# Patient Record
Sex: Female | Born: 1990 | Race: Black or African American | Hispanic: No | Marital: Married | Smoking: Never smoker
Health system: Southern US, Community
[De-identification: ages and names within clinical notes are randomized; demographics above are authoritative.]

## PROBLEM LIST (undated history)

## (undated) DIAGNOSIS — IMO0002 Reserved for concepts with insufficient information to code with codable children: Secondary | ICD-10-CM

## (undated) DIAGNOSIS — F329 Major depressive disorder, single episode, unspecified: Secondary | ICD-10-CM

## (undated) HISTORY — DX: Reserved for concepts with insufficient information to code with codable children: IMO0002

## (undated) HISTORY — DX: Major depressive disorder, single episode, unspecified: F32.9

---

## 2013-02-02 HISTORY — PX: BREAST BIOPSY: SHX20

## 2014-09-24 ENCOUNTER — Other Ambulatory Visit (HOSPITAL_COMMUNITY): Payer: Self-pay | Admitting: Maternal and Fetal Medicine

## 2014-09-24 DIAGNOSIS — Z3689 Encounter for other specified antenatal screening: Secondary | ICD-10-CM

## 2014-09-24 DIAGNOSIS — Z3A33 33 weeks gestation of pregnancy: Secondary | ICD-10-CM

## 2014-09-24 LAB — OB RESULTS CONSOLE ABO/RH: RH TYPE: POSITIVE

## 2014-09-24 LAB — OB RESULTS CONSOLE ANTIBODY SCREEN: ANTIBODY SCREEN: NEGATIVE

## 2014-09-24 LAB — OB RESULTS CONSOLE HEPATITIS B SURFACE ANTIGEN: Hepatitis B Surface Ag: NEGATIVE

## 2014-09-24 LAB — OB RESULTS CONSOLE RPR: RPR: NONREACTIVE

## 2014-09-24 LAB — OB RESULTS CONSOLE GC/CHLAMYDIA
CHLAMYDIA, DNA PROBE: NEGATIVE
GC PROBE AMP, GENITAL: NEGATIVE

## 2014-09-24 LAB — OB RESULTS CONSOLE RUBELLA ANTIBODY, IGM: RUBELLA: IMMUNE

## 2014-09-24 LAB — OB RESULTS CONSOLE HIV ANTIBODY (ROUTINE TESTING): HIV: NONREACTIVE

## 2014-09-27 ENCOUNTER — Ambulatory Visit (HOSPITAL_COMMUNITY)
Admission: RE | Admit: 2014-09-27 | Discharge: 2014-09-27 | Disposition: A | Discharge status: Home / Self Care | Payer: Self-pay | Source: Ambulatory Visit | Attending: Nurse Practitioner | Admitting: Nurse Practitioner

## 2014-09-27 DIAGNOSIS — Z3689 Encounter for other specified antenatal screening: Secondary | ICD-10-CM

## 2014-09-27 DIAGNOSIS — Z3A33 33 weeks gestation of pregnancy: Secondary | ICD-10-CM | POA: Insufficient documentation

## 2014-09-27 DIAGNOSIS — Z36 Encounter for antenatal screening of mother: Secondary | ICD-10-CM | POA: Insufficient documentation

## 2014-10-01 ENCOUNTER — Other Ambulatory Visit (HOSPITAL_COMMUNITY): Payer: Self-pay | Admitting: Maternal and Fetal Medicine

## 2014-10-01 DIAGNOSIS — Z3689 Encounter for other specified antenatal screening: Secondary | ICD-10-CM

## 2014-10-01 DIAGNOSIS — Z3A33 33 weeks gestation of pregnancy: Secondary | ICD-10-CM

## 2014-10-22 LAB — OB RESULTS CONSOLE GC/CHLAMYDIA
Chlamydia: NEGATIVE
GC PROBE AMP, GENITAL: NEGATIVE

## 2014-10-22 LAB — OB RESULTS CONSOLE GBS: STREP GROUP B AG: NEGATIVE

## 2014-11-15 ENCOUNTER — Other Ambulatory Visit (HOSPITAL_COMMUNITY): Payer: Self-pay | Admitting: Nurse Practitioner

## 2014-11-15 DIAGNOSIS — Z369 Encounter for antenatal screening, unspecified: Secondary | ICD-10-CM

## 2014-11-15 DIAGNOSIS — O480 Post-term pregnancy: Secondary | ICD-10-CM

## 2014-11-15 DIAGNOSIS — Z3A40 40 weeks gestation of pregnancy: Secondary | ICD-10-CM

## 2014-11-19 ENCOUNTER — Ambulatory Visit (HOSPITAL_COMMUNITY): Admission: RE | Admit: 2014-11-19 | Payer: Self-pay | Source: Ambulatory Visit

## 2014-11-21 ENCOUNTER — Encounter (HOSPITAL_COMMUNITY): Payer: Self-pay | Admitting: *Deleted

## 2014-11-21 ENCOUNTER — Telehealth (HOSPITAL_COMMUNITY): Payer: Self-pay | Admitting: *Deleted

## 2014-11-21 NOTE — Telephone Encounter (Signed)
Preadmission screen  

## 2014-11-23 ENCOUNTER — Inpatient Hospital Stay (HOSPITAL_COMMUNITY): Payer: Self-pay

## 2014-11-25 ENCOUNTER — Inpatient Hospital Stay (HOSPITAL_COMMUNITY): Payer: Medicaid Other | Admitting: Anesthesiology

## 2014-11-25 ENCOUNTER — Encounter (HOSPITAL_COMMUNITY): Payer: Self-pay

## 2014-11-25 ENCOUNTER — Inpatient Hospital Stay (HOSPITAL_COMMUNITY)
Admission: RE | Admit: 2014-11-25 | Discharge: 2014-11-28 | DRG: 766 | Disposition: A | Discharge status: Home / Self Care | Payer: Medicaid Other | Source: Ambulatory Visit | Attending: Family Medicine | Admitting: Family Medicine

## 2014-11-25 DIAGNOSIS — O480 Post-term pregnancy: Principal | ICD-10-CM | POA: Diagnosis present

## 2014-11-25 DIAGNOSIS — O34211 Maternal care for low transverse scar from previous cesarean delivery: Secondary | ICD-10-CM | POA: Diagnosis present

## 2014-11-25 DIAGNOSIS — IMO0002 Reserved for concepts with insufficient information to code with codable children: Secondary | ICD-10-CM | POA: Diagnosis present

## 2014-11-25 DIAGNOSIS — Z3A41 41 weeks gestation of pregnancy: Secondary | ICD-10-CM | POA: Diagnosis not present

## 2014-11-25 DIAGNOSIS — Z8249 Family history of ischemic heart disease and other diseases of the circulatory system: Secondary | ICD-10-CM | POA: Diagnosis not present

## 2014-11-25 DIAGNOSIS — O1494 Unspecified pre-eclampsia, complicating childbirth: Secondary | ICD-10-CM | POA: Diagnosis present

## 2014-11-25 DIAGNOSIS — Z832 Family history of diseases of the blood and blood-forming organs and certain disorders involving the immune mechanism: Secondary | ICD-10-CM

## 2014-11-25 DIAGNOSIS — Z825 Family history of asthma and other chronic lower respiratory diseases: Secondary | ICD-10-CM | POA: Diagnosis not present

## 2014-11-25 LAB — CBC
HCT: 28.7 % — ABNORMAL LOW (ref 36.0–46.0)
HEMOGLOBIN: 9.4 g/dL — AB (ref 12.0–15.0)
MCH: 26.9 pg (ref 26.0–34.0)
MCHC: 32.8 g/dL (ref 30.0–36.0)
MCV: 82 fL (ref 78.0–100.0)
PLATELETS: 207 10*3/uL (ref 150–400)
RBC: 3.5 MIL/uL — AB (ref 3.87–5.11)
RDW: 14.3 % (ref 11.5–15.5)
WBC: 9.7 10*3/uL (ref 4.0–10.5)

## 2014-11-25 LAB — RPR: RPR: NONREACTIVE

## 2014-11-25 LAB — ABO/RH: ABO/RH(D): O POS

## 2014-11-25 LAB — TYPE AND SCREEN
ABO/RH(D): O POS
ANTIBODY SCREEN: NEGATIVE

## 2014-11-25 MED ORDER — FENTANYL 2.5 MCG/ML BUPIVACAINE 1/10 % EPIDURAL INFUSION (WH - ANES)
14.0000 mL/h | INTRAMUSCULAR | Status: DC | PRN
  Administered 2014-11-25 – 2014-11-26 (×2): 12 mL/h via EPIDURAL
  Administered 2014-11-26: 14 mL/h via EPIDURAL
  Filled 2014-11-25 (×4): qty 125

## 2014-11-25 MED ORDER — ONDANSETRON HCL 4 MG/2ML IJ SOLN: 4.0000 mg | Freq: Four times a day (QID) | INTRAMUSCULAR | Status: DC | PRN

## 2014-11-25 MED ORDER — LIDOCAINE-EPINEPHRINE (PF) 2 %-1:200000 IJ SOLN
INTRAMUSCULAR | Status: DC | PRN
  Administered 2014-11-25: 3 mL

## 2014-11-25 MED ORDER — BUPIVACAINE HCL (PF) 0.25 % IJ SOLN
INTRAMUSCULAR | Status: DC | PRN
  Administered 2014-11-25 (×2): 4 mL via EPIDURAL

## 2014-11-25 MED ORDER — PHENYLEPHRINE 40 MCG/ML (10ML) SYRINGE FOR IV PUSH (FOR BLOOD PRESSURE SUPPORT)
80.0000 ug | PREFILLED_SYRINGE | INTRAVENOUS | Status: DC | PRN
  Administered 2014-11-25: 80 ug via INTRAVENOUS
  Filled 2014-11-25: qty 20

## 2014-11-25 MED ORDER — LACTATED RINGERS IV SOLN
500.0000 mL | INTRAVENOUS | Status: DC | PRN
  Administered 2014-11-25: 500 mL via INTRAVENOUS

## 2014-11-25 MED ORDER — CITRIC ACID-SODIUM CITRATE 334-500 MG/5ML PO SOLN
30.0000 mL | ORAL | Status: DC | PRN
  Administered 2014-11-26: 30 mL via ORAL
  Filled 2014-11-25: qty 15

## 2014-11-25 MED ORDER — FLEET ENEMA 7-19 GM/118ML RE ENEM: 1.0000 | ENEMA | RECTAL | Status: DC | PRN

## 2014-11-25 MED ORDER — OXYCODONE-ACETAMINOPHEN 5-325 MG PO TABS: 2.0000 | ORAL_TABLET | ORAL | Status: DC | PRN

## 2014-11-25 MED ORDER — OXYCODONE-ACETAMINOPHEN 5-325 MG PO TABS: 1.0000 | ORAL_TABLET | ORAL | Status: DC | PRN

## 2014-11-25 MED ORDER — OXYTOCIN 40 UNITS IN LACTATED RINGERS INFUSION - SIMPLE MED
1.0000 m[IU]/min | INTRAVENOUS | Status: DC
  Administered 2014-11-25: 2 m[IU]/min via INTRAVENOUS

## 2014-11-25 MED ORDER — FENTANYL CITRATE (PF) 100 MCG/2ML IJ SOLN
100.0000 ug | INTRAMUSCULAR | Status: DC | PRN
  Administered 2014-11-25 (×2): 100 ug via INTRAVENOUS
  Filled 2014-11-25 (×2): qty 2

## 2014-11-25 MED ORDER — LIDOCAINE HCL (PF) 1 % IJ SOLN: 30.0000 mL | INTRAMUSCULAR | Status: DC | PRN

## 2014-11-25 MED ORDER — EPHEDRINE 5 MG/ML INJ: 10.0000 mg | INTRAVENOUS | Status: DC | PRN

## 2014-11-25 MED ORDER — OXYTOCIN 40 UNITS IN LACTATED RINGERS INFUSION - SIMPLE MED
62.5000 mL/h | INTRAVENOUS | Status: DC
  Filled 2014-11-25: qty 1000

## 2014-11-25 MED ORDER — LACTATED RINGERS IV SOLN
INTRAVENOUS | Status: DC
  Administered 2014-11-26 (×2): via INTRAVENOUS
  Administered 2014-11-26: 125 mL/h via INTRAVENOUS

## 2014-11-25 MED ORDER — TERBUTALINE SULFATE 1 MG/ML IJ SOLN: 0.2500 mg | Freq: Once | INTRAMUSCULAR | Status: DC | PRN

## 2014-11-25 MED ORDER — OXYTOCIN BOLUS FROM INFUSION: 500.0000 mL | INTRAVENOUS | Status: DC

## 2014-11-25 MED ORDER — ACETAMINOPHEN 325 MG PO TABS: 650.0000 mg | ORAL_TABLET | ORAL | Status: DC | PRN

## 2014-11-25 MED ORDER — DIPHENHYDRAMINE HCL 50 MG/ML IJ SOLN: 12.5000 mg | INTRAMUSCULAR | Status: DC | PRN
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
## 2014-11-25 NOTE — Anesthesia Procedure Notes (Signed)
Epidural Patient location during procedure: OB  Staffing Anesthesiologist: Caylor Tallarico Performed by: anesthesiologist   Preanesthetic Checklist Completed: patient identified, surgical consent, pre-op evaluation, timeout performed, IV checked, risks and benefits discussed and monitors and equipment checked  Epidural Patient position: sitting Prep: DuraPrep Patient monitoring: heart rate, cardiac monitor, continuous pulse ox and blood pressure Approach: midline Location: L3-L4 Injection technique: LOR saline  Needle:  Needle type: Tuohy  Needle gauge: 17 G Needle length: 9 cm Needle insertion depth: 7 cm Catheter type: closed end flexible Catheter size: 19 Gauge Catheter at skin depth: 13 cm Test dose: negative and 2% lidocaine with Epi 1:200 K  Assessment Events: blood not aspirated, injection not painful, no injection resistance, negative IV test and no paresthesia  Additional Notes Reason for block:procedure for pain   

## 2014-11-25 NOTE — Progress Notes (Signed)
Lisa Sanchez is a 24 y.o. G2P1001 at 8722w6d by ultrasound admitted for induction of labor due to Post dates. Due date 10/15.  Subjective:   Objective: BP 108/57 mmHg  Pulse 96  Temp(Src) 98.1 F (36.7 C) (Oral)  Resp 18  Ht 5\' 6"  (1.676 m)  Wt 185 lb (83.915 kg)  BMI 29.87 kg/m2  SpO2 97%  LMP 01/23/2014      FHT:  FHR: 130 bpm, variability: moderate,  accelerations:  Present,  decelerations:  Absent UC:   regular, every 3-5 minutes SVE:   Dilation: 4 Effacement (%): 60 Station: -3 Exam by:: katherine g jones RN   Labs: Lab Results  Component Value Date   WBC 9.7 11/25/2014   HGB 9.4* 11/25/2014   HCT 28.7* 11/25/2014   MCV 82.0 11/25/2014   PLT 207 11/25/2014    Assessment / Plan: Induction of labor due to postterm,  progressing well on pitocin  Labor: Progressing on Pitocin, will continue to increase then AROM Preeclampsia:  no signs or symptoms of toxicity and intake and ouput balanced Fetal Wellbeing:  Category I Pain Control:  Labor support without medications I/D:  n/a Anticipated MOD:  NSVD  Lisa Sanchez Lisa Sanchez 11/25/2014, 3:25 PM

## 2014-11-25 NOTE — Progress Notes (Addendum)
Pt states that she has been unable to void since 2230 last pm. Pt also states that she has significant urge to void and has tried for 15 minutes with no success. Foley bulb placement assessed and found to be in the anatomical right side of patient's vaginal vault.Dr. Sampson GoonFitzgerald notified and states coming to assess.

## 2014-11-25 NOTE — Progress Notes (Signed)
Per Dr Ashok PallWouk at report this a.m.: Foley bulb reinserted at 7 a.m. Will await expulsion of FB, and then go to pitocin IOL.

## 2014-11-25 NOTE — Anesthesia Preprocedure Evaluation (Signed)

## 2014-11-25 NOTE — Progress Notes (Signed)
Lisa BourbonHawi Stuber is a 24 y.o. G2P1001 at 6143w6d by ultrasound admitted for induction of labor due to Post dates. Due date 10/15.  Subjective:   Objective: BP 110/66 mmHg  Pulse 86  Temp(Src) 98.2 F (36.8 C) (Oral)  Resp 18  Ht 5\' 6"  (1.676 m)  Wt 185 lb (83.915 kg)  BMI 29.87 kg/m2  LMP 01/23/2014      FHT:  FHR: 140 bpm, variability: moderate,  accelerations:  Present,  decelerations:  Absent UC:   regular, every 2-5 minutes SVE:   Dilation: 3 Effacement (%): Thick Station: -2 Exam by:: katherine g jones RN   Labs: Lab Results  Component Value Date   WBC 9.7 11/25/2014   HGB 9.4* 11/25/2014   HCT 28.7* 11/25/2014   MCV 82.0 11/25/2014   PLT 207 11/25/2014    Assessment / Plan: Induction of labor due to postterm,  progressing well on pitocin  Labor: Progressing normally Preeclampsia:  no signs or symptoms of toxicity and intake and ouput balanced Fetal Wellbeing:  Category I Pain Control:  Labor support without medications I/D:  n/a Anticipated MOD:  NSVD  LAWSON, MARIE DARLENE 11/25/2014, 10:55 AM

## 2014-11-25 NOTE — Progress Notes (Signed)
Lisa Sanchez is a 24 y.o. G2P1001 at 6528w6d by ultrasound admitted for induction of labor due to Post dates. Due date 10/17.  Subjective: Pt very comfortable with epidural. FSE and IUPC in place. Pt was 5 cm at 4 pm.  Objective: BP 124/61 mmHg  Pulse 89  Temp(Src) 97.8 F (36.6 C) (Oral)  Resp 16  Ht 5\' 6"  (1.676 m)  Wt 83.915 kg (185 lb)  BMI 29.87 kg/m2  SpO2 98%  LMP 01/23/2014 I/O last 3 completed shifts: In: -  Out: 1700 [Urine:1700]    FHT:  FHR: 150 bpm, variability: moderate,  accelerations:  Present,  decelerations:  Absent UC:   regular, every 3.5 minutes SVE:   Dilation: 4.5 Effacement (%): 50 Station: -2 Exam by:: Dr. Emelda Sanchez  Labs: Lab Results  Component Value Date   WBC 9.7 11/25/2014   HGB 9.4* 11/25/2014   HCT 28.7* 11/25/2014   MCV 82.0 11/25/2014   PLT 207 11/25/2014    Assessment / Plan: IOL for postterm pregnancy, prolonged latent phase, with fetal and maternal wellbeing at present.  Labor: still in latent phase labor. Cervical dilation so far primarily from Foley. Preeclampsia:   Fetal Wellbeing:  Category I Pain Control:  Epidural I/D:  n/a Anticipated MOD:  undetermined  Lisa Sanchez V 11/25/2014, 9:38 PM

## 2014-11-26 ENCOUNTER — Encounter (HOSPITAL_COMMUNITY)
Admission: RE | Disposition: A | Discharge status: Home / Self Care | Payer: Self-pay | Source: Ambulatory Visit | Attending: Family Medicine

## 2014-11-26 ENCOUNTER — Encounter (HOSPITAL_COMMUNITY): Payer: Self-pay

## 2014-11-26 DIAGNOSIS — O480 Post-term pregnancy: Secondary | ICD-10-CM

## 2014-11-26 DIAGNOSIS — Z3A41 41 weeks gestation of pregnancy: Secondary | ICD-10-CM

## 2014-11-26 DIAGNOSIS — O34211 Maternal care for low transverse scar from previous cesarean delivery: Secondary | ICD-10-CM

## 2014-11-26 SURGERY — Surgical Case
Anesthesia: Epidural

## 2014-11-26 MED ORDER — IBUPROFEN 600 MG PO TABS
600.0000 mg | ORAL_TABLET | Freq: Four times a day (QID) | ORAL | Status: DC
  Administered 2014-11-26 – 2014-11-28 (×7): 600 mg via ORAL
  Filled 2014-11-26 (×7): qty 1

## 2014-11-26 MED ORDER — FENTANYL CITRATE (PF) 100 MCG/2ML IJ SOLN: 25.0000 ug | INTRAMUSCULAR | Status: DC | PRN

## 2014-11-26 MED ORDER — SCOPOLAMINE 1 MG/3DAYS TD PT72: 1.0000 | MEDICATED_PATCH | Freq: Once | TRANSDERMAL | Status: DC

## 2014-11-26 MED ORDER — DEXAMETHASONE SODIUM PHOSPHATE 10 MG/ML IJ SOLN
INTRAMUSCULAR | Status: DC | PRN
  Administered 2014-11-26: 10 mg via INTRAVENOUS

## 2014-11-26 MED ORDER — DIPHENHYDRAMINE HCL 25 MG PO CAPS: 25.0000 mg | ORAL_CAPSULE | ORAL | Status: DC | PRN

## 2014-11-26 MED ORDER — MEPERIDINE HCL 25 MG/ML IJ SOLN: 6.2500 mg | INTRAMUSCULAR | Status: DC | PRN

## 2014-11-26 MED ORDER — SENNOSIDES-DOCUSATE SODIUM 8.6-50 MG PO TABS
2.0000 | ORAL_TABLET | ORAL | Status: DC
  Administered 2014-11-26 – 2014-11-28 (×2): 2 via ORAL
  Filled 2014-11-26 (×2): qty 2

## 2014-11-26 MED ORDER — ONDANSETRON HCL 4 MG/2ML IJ SOLN
INTRAMUSCULAR | Status: AC
  Filled 2014-11-26: qty 2

## 2014-11-26 MED ORDER — DIBUCAINE 1 % RE OINT: 1.0000 "application " | TOPICAL_OINTMENT | RECTAL | Status: DC | PRN

## 2014-11-26 MED ORDER — OXYCODONE-ACETAMINOPHEN 5-325 MG PO TABS: 1.0000 | ORAL_TABLET | ORAL | Status: DC | PRN

## 2014-11-26 MED ORDER — PHENYLEPHRINE HCL 10 MG/ML IJ SOLN
INTRAMUSCULAR | Status: DC | PRN
  Administered 2014-11-26 (×2): 80 ug via INTRAVENOUS

## 2014-11-26 MED ORDER — KETOROLAC TROMETHAMINE 30 MG/ML IJ SOLN: 30.0000 mg | Freq: Four times a day (QID) | INTRAMUSCULAR | Status: AC | PRN

## 2014-11-26 MED ORDER — VITAMIN K1 1 MG/0.5ML IJ SOLN
INTRAMUSCULAR | Status: AC
  Filled 2014-11-26: qty 0.5

## 2014-11-26 MED ORDER — SIMETHICONE 80 MG PO CHEW
80.0000 mg | CHEWABLE_TABLET | ORAL | Status: DC
  Administered 2014-11-26: 80 mg via ORAL
  Filled 2014-11-26 (×2): qty 1

## 2014-11-26 MED ORDER — LACTATED RINGERS IV SOLN: 125.0000 mL/h | INTRAVENOUS | Status: DC

## 2014-11-26 MED ORDER — LIDOCAINE-EPINEPHRINE (PF) 2 %-1:200000 IJ SOLN
INTRAMUSCULAR | Status: AC
  Filled 2014-11-26: qty 20

## 2014-11-26 MED ORDER — LACTATED RINGERS IV SOLN: INTRAVENOUS | Status: DC

## 2014-11-26 MED ORDER — ONDANSETRON HCL 4 MG/2ML IJ SOLN: 4.0000 mg | Freq: Three times a day (TID) | INTRAMUSCULAR | Status: DC | PRN

## 2014-11-26 MED ORDER — NALOXONE HCL 0.4 MG/ML IJ SOLN: 0.4000 mg | INTRAMUSCULAR | Status: DC | PRN

## 2014-11-26 MED ORDER — DEXAMETHASONE SODIUM PHOSPHATE 10 MG/ML IJ SOLN
INTRAMUSCULAR | Status: AC
  Filled 2014-11-26: qty 1

## 2014-11-26 MED ORDER — SODIUM CHLORIDE 0.9 % IJ SOLN: 3.0000 mL | INTRAMUSCULAR | Status: DC | PRN

## 2014-11-26 MED ORDER — SODIUM BICARBONATE 8.4 % IV SOLN
INTRAVENOUS | Status: AC
  Filled 2014-11-26: qty 50

## 2014-11-26 MED ORDER — NALBUPHINE HCL 10 MG/ML IJ SOLN: 5.0000 mg | Freq: Once | INTRAMUSCULAR | Status: DC | PRN

## 2014-11-26 MED ORDER — OXYTOCIN 40 UNITS IN LACTATED RINGERS INFUSION - SIMPLE MED: 62.5000 mL/h | INTRAVENOUS | Status: AC

## 2014-11-26 MED ORDER — ACETAMINOPHEN 325 MG PO TABS
650.0000 mg | ORAL_TABLET | ORAL | Status: DC | PRN
Start: 2014-11-26 — End: 2014-11-28

## 2014-11-26 MED ORDER — OXYCODONE-ACETAMINOPHEN 5-325 MG PO TABS: 2.0000 | ORAL_TABLET | ORAL | Status: DC | PRN

## 2014-11-26 MED ORDER — ZOLPIDEM TARTRATE 5 MG PO TABS
5.0000 mg | ORAL_TABLET | Freq: Every evening | ORAL | Status: DC | PRN
Start: 2014-11-26 — End: 2014-11-28

## 2014-11-26 MED ORDER — NALOXONE HCL 2 MG/2ML IJ SOSY: 1.0000 ug/kg/h | PREFILLED_SYRINGE | INTRAVENOUS | Status: DC | PRN

## 2014-11-26 MED ORDER — LANOLIN HYDROUS EX OINT: 1.0000 "application " | TOPICAL_OINTMENT | CUTANEOUS | Status: DC | PRN

## 2014-11-26 MED ORDER — SCOPOLAMINE 1 MG/3DAYS TD PT72
MEDICATED_PATCH | TRANSDERMAL | Status: AC
  Filled 2014-11-26: qty 1

## 2014-11-26 MED ORDER — OXYTOCIN 10 UNIT/ML IJ SOLN
40.0000 [IU] | INTRAMUSCULAR | Status: DC | PRN
  Administered 2014-11-26: 40 [IU] via INTRAVENOUS

## 2014-11-26 MED ORDER — DIPHENHYDRAMINE HCL 50 MG/ML IJ SOLN: 12.5000 mg | INTRAMUSCULAR | Status: DC | PRN

## 2014-11-26 MED ORDER — NALBUPHINE HCL 10 MG/ML IJ SOLN: 5.0000 mg | INTRAMUSCULAR | Status: DC | PRN

## 2014-11-26 MED ORDER — DIPHENHYDRAMINE HCL 25 MG PO CAPS: 25.0000 mg | ORAL_CAPSULE | Freq: Four times a day (QID) | ORAL | Status: DC | PRN

## 2014-11-26 MED ORDER — PHENYLEPHRINE 40 MCG/ML (10ML) SYRINGE FOR IV PUSH (FOR BLOOD PRESSURE SUPPORT)
PREFILLED_SYRINGE | INTRAVENOUS | Status: AC
  Filled 2014-11-26: qty 10

## 2014-11-26 MED ORDER — OXYTOCIN 40 UNITS IN LACTATED RINGERS INFUSION - SIMPLE MED
1.0000 m[IU]/min | INTRAVENOUS | Status: DC
  Administered 2014-11-26: 8 m[IU]/min via INTRAVENOUS
  Filled 2014-11-26: qty 1000

## 2014-11-26 MED ORDER — 0.9 % SODIUM CHLORIDE (POUR BTL) OPTIME
TOPICAL | Status: DC | PRN
  Administered 2014-11-26: 1000 mL

## 2014-11-26 MED ORDER — TETANUS-DIPHTH-ACELL PERTUSSIS 5-2.5-18.5 LF-MCG/0.5 IM SUSP: 0.5000 mL | Freq: Once | INTRAMUSCULAR | Status: DC

## 2014-11-26 MED ORDER — ONDANSETRON HCL 4 MG/2ML IJ SOLN
INTRAMUSCULAR | Status: DC | PRN
  Administered 2014-11-26: 4 mg via INTRAVENOUS

## 2014-11-26 MED ORDER — LACTATED RINGERS IV SOLN
INTRAVENOUS | Status: DC | PRN
  Administered 2014-11-26: 17:00:00 via INTRAVENOUS

## 2014-11-26 MED ORDER — WITCH HAZEL-GLYCERIN EX PADS: 1.0000 "application " | MEDICATED_PAD | CUTANEOUS | Status: DC | PRN

## 2014-11-26 MED ORDER — ONDANSETRON HCL 4 MG/2ML IJ SOLN: 4.0000 mg | Freq: Once | INTRAMUSCULAR | Status: DC | PRN

## 2014-11-26 MED ORDER — SODIUM BICARBONATE 8.4 % IV SOLN
INTRAVENOUS | Status: DC | PRN
  Administered 2014-11-26 (×4): 5 mL via EPIDURAL

## 2014-11-26 MED ORDER — CEFAZOLIN SODIUM-DEXTROSE 2-3 GM-% IV SOLR
INTRAVENOUS | Status: DC | PRN
  Administered 2014-11-26: 2 g via INTRAVENOUS

## 2014-11-26 MED ORDER — SIMETHICONE 80 MG PO CHEW: 80.0000 mg | CHEWABLE_TABLET | ORAL | Status: DC | PRN

## 2014-11-26 MED ORDER — SIMETHICONE 80 MG PO CHEW
80.0000 mg | CHEWABLE_TABLET | Freq: Three times a day (TID) | ORAL | Status: DC
  Administered 2014-11-27 – 2014-11-28 (×4): 80 mg via ORAL
  Filled 2014-11-26 (×4): qty 1

## 2014-11-26 MED ORDER — MORPHINE SULFATE (PF) 0.5 MG/ML IJ SOLN
INTRAMUSCULAR | Status: DC | PRN
  Administered 2014-11-26: 1 mg via INTRAVENOUS
  Administered 2014-11-26: 4 mg via EPIDURAL

## 2014-11-26 MED ORDER — ERYTHROMYCIN 5 MG/GM OP OINT
TOPICAL_OINTMENT | OPHTHALMIC | Status: AC
  Filled 2014-11-26: qty 1

## 2014-11-26 MED ORDER — SCOPOLAMINE 1 MG/3DAYS TD PT72
MEDICATED_PATCH | TRANSDERMAL | Status: DC | PRN
  Administered 2014-11-26: 1 via TRANSDERMAL

## 2014-11-26 MED ORDER — OXYTOCIN 10 UNIT/ML IJ SOLN
INTRAMUSCULAR | Status: AC
  Filled 2014-11-26: qty 4

## 2014-11-26 MED ORDER — PRENATAL MULTIVITAMIN CH
1.0000 | ORAL_TABLET | Freq: Every day | ORAL | Status: DC
  Administered 2014-11-27 – 2014-11-28 (×2): 1 via ORAL
  Filled 2014-11-26 (×2): qty 1

## 2014-11-26 MED ORDER — MORPHINE SULFATE (PF) 0.5 MG/ML IJ SOLN
INTRAMUSCULAR | Status: AC
  Filled 2014-11-26: qty 100

## 2014-11-26 SURGICAL SUPPLY — 35 items
BENZOIN TINCTURE PRP APPL 2/3 (GAUZE/BANDAGES/DRESSINGS) ×3 IMPLANT
CATH ROBINSON RED A/P 16FR (CATHETERS) IMPLANT
CLAMP CORD UMBIL (MISCELLANEOUS) IMPLANT
CLOSURE WOUND 1/2 X4 (GAUZE/BANDAGES/DRESSINGS) ×2
CLOTH BEACON ORANGE TIMEOUT ST (SAFETY) ×3 IMPLANT
DRAPE SHEET LG 3/4 BI-LAMINATE (DRAPES) IMPLANT
DRSG OPSITE POSTOP 4X10 (GAUZE/BANDAGES/DRESSINGS) ×3 IMPLANT
DURAPREP 26ML APPLICATOR (WOUND CARE) ×3 IMPLANT
ELECT REM PT RETURN 9FT ADLT (ELECTROSURGICAL) ×3
ELECTRODE REM PT RTRN 9FT ADLT (ELECTROSURGICAL) ×1 IMPLANT
EXTRACTOR VACUUM M CUP 4 TUBE (SUCTIONS) IMPLANT
EXTRACTOR VACUUM M CUP 4' TUBE (SUCTIONS)
GLOVE BIOGEL PI IND STRL 7.5 (GLOVE) ×2 IMPLANT
GLOVE BIOGEL PI INDICATOR 7.5 (GLOVE) ×4
GLOVE ECLIPSE 7.5 STRL STRAW (GLOVE) ×3 IMPLANT
GOWN STRL REUS W/TWL LRG LVL3 (GOWN DISPOSABLE) ×9 IMPLANT
KIT ABG SYR 3ML LUER SLIP (SYRINGE) IMPLANT
NEEDLE HYPO 25X5/8 SAFETYGLIDE (NEEDLE) IMPLANT
NS IRRIG 1000ML POUR BTL (IV SOLUTION) ×3 IMPLANT
PACK C SECTION WH (CUSTOM PROCEDURE TRAY) ×3 IMPLANT
PAD ABD 8X7 1/2 STERILE (GAUZE/BANDAGES/DRESSINGS) ×6 IMPLANT
PAD OB MATERNITY 4.3X12.25 (PERSONAL CARE ITEMS) ×3 IMPLANT
PENCIL SMOKE EVAC W/HOLSTER (ELECTROSURGICAL) IMPLANT
RTRCTR C-SECT PINK 25CM LRG (MISCELLANEOUS) ×3 IMPLANT
STRIP CLOSURE SKIN 1/2X4 (GAUZE/BANDAGES/DRESSINGS) ×4 IMPLANT
SUT MNCRL 0 VIOLET CTX 36 (SUTURE) IMPLANT
SUT MON AB 2-0 CT1 27 (SUTURE) IMPLANT
SUT MONOCRYL 0 CTX 36 (SUTURE)
SUT VIC AB 0 CTX 36 (SUTURE) ×6
SUT VIC AB 0 CTX36XBRD ANBCTRL (SUTURE) ×3 IMPLANT
SUT VIC AB 2-0 CT1 27 (SUTURE) ×6
SUT VIC AB 2-0 CT1 TAPERPNT 27 (SUTURE) ×3 IMPLANT
SUT VIC AB 4-0 KS 27 (SUTURE) ×3 IMPLANT
TOWEL OR 17X24 6PK STRL BLUE (TOWEL DISPOSABLE) ×3 IMPLANT
TRAY FOLEY CATH SILVER 14FR (SET/KITS/TRAYS/PACK) IMPLANT

## 2014-11-26 NOTE — Progress Notes (Signed)
Patient ID: Veatrice BourbonHawi Sanchez, female   DOB: [birth year], 24 y.o.   MRN: [id number]  Patient hasn't changed her cervix in several hours.  Pitocin at 130miliunits/min with MVUs ranging from 160-200.  Discussed options with patient regarding continuing pitocin versus repeat cesarean section.  Patient with ruptured membranes for greater than 24 hours.  Patient agrees for cesarean section.  The risks of cesarean section discussed with the patient included but were not limited to: bleeding which may require transfusion or reoperation; infection which may require antibiotics; injury to bowel, bladder, ureters or other surrounding organs; injury to the fetus; need for additional procedures including hysterectomy in the event of a life-threatening hemorrhage; placental abnormalities wth subsequent pregnancies, incisional problems, thromboembolic phenomenon and other postoperative/anesthesia complications. The patient concurred with the proposed plan, giving informed written consent for the procedure.   Patient has been NPO since last night she will remain NPO for procedure. Anesthesia and OR aware.  Preoperative prophylactic Ancef ordered on call to the OR.  To OR when ready.  Levie HeritageJacob J Stinson, DO 11/26/2014 4:23 PM

## 2014-11-26 NOTE — Progress Notes (Signed)
Lisa Sanchez is a 24 y.o. G2P1001 at 2183w0d   Subjective: Patient comfortable with epidural.  Objective: BP 114/62 mmHg  Pulse 94  Temp(Src) 99.4 F (37.4 C) (Oral)  Resp 18  Ht 5\' 6"  (1.676 m)  Wt 185 lb (83.915 kg)  BMI 29.87 kg/m2  SpO2 98%  LMP 01/23/2014 I/O last 3 completed shifts: In: 690.4 [I.V.:690.4] Out: 3700 [Urine:3700] Total I/O In: -  Out: 850 [Urine:850]  FHT:  FHR: 135 bpm, variability: moderate,  accelerations:  Present,  decelerations:  Absent UC:   irregular, every 2-4 minutes SVE:   Dilation: 5 Effacement (%): 80 Station: -1, -2 Exam by:: Stinston, MD  Labs: Lab Results  Component Value Date   WBC 9.7 11/25/2014   HGB 9.4* 11/25/2014   HCT 28.7* 11/25/2014   MCV 82.0 11/25/2014   PLT 207 11/25/2014    Assessment / Plan: Having some progress, although minimal.  Will continue with pitocin.   Preeclampsia:  no signs or symptoms of toxicity Fetal Wellbeing:  Category I Pain Control:  Epidural I/D:  n/a Anticipated MOD:  NSVD  Lisa Sanchez Lisa Sanchez 11/26/2014, 1:30 PM

## 2014-11-26 NOTE — Progress Notes (Signed)
Bedside report given and care relinquished to K. Wendie SimmerForsell, Charity fundraiserN.

## 2014-11-26 NOTE — Progress Notes (Signed)
Lisa Sanchez is a 24 y.o. G2P1001 at 4016w0d by ultrasound admitted for induction of labor due to Post dates. Due date 10/17 .  Subjective: pt remains stable with IUPC in place and FSE applied.    Objective: BP 107/50 mmHg  Pulse 90  Temp(Src) 98.6 F (37 C) (Oral)  Resp 18  Ht 5\' 6"  (1.676 m)  Wt 83.915 kg (185 lb)  BMI 29.87 kg/m2  SpO2 98%  LMP 01/23/2014 I/O last 3 completed shifts: In: -  Out: 1700 [Urine:1700]    FHT:  FHR: 150 bpm, variability: moderate,  accelerations:  Present,  decelerations:  Absent UC:   regular, every 4 minutes MVU's remain suboptimal, at 140.   SVE:   Dilation: 4.5 Effacement (%): 50 Station: -2 Exam by:: Dr. Emelda FearFerguson  Labs: Lab Results  Component Value Date   WBC 9.7 11/25/2014   HGB 9.4* 11/25/2014   HCT 28.7* 11/25/2014   MCV 82.0 11/25/2014   PLT 207 11/25/2014    Assessment / Plan: Protracted latent phase  Labor: will take a 3 hr pitocin break and restart at 3am. Preeclampsia:   Fetal Wellbeing:  Category I Pain Control:  Epidural I/D:  n/a Anticipated MOD:  undetermined  Lisa Sanchez V 11/26/2014, 12:28 AM

## 2014-11-26 NOTE — Anesthesia Postprocedure Evaluation (Signed)
  Anesthesia Post-op Note  Patient: Lisa Sanchez  Procedure(s) Performed: Procedure(s) (LRB): CESAREAN SECTION (N/A)  Patient Location: PACU  Anesthesia Type: Epidural  Level of Consciousness: awake and alert   Airway and Oxygen Therapy: Patient Spontanous Breathing  Post-op Pain: mild  Post-op Assessment: Post-op Vital signs reviewed, Patient's Cardiovascular Status Stable, Respiratory Function Stable, Patent Airway and No signs of Nausea or vomiting  Last Vitals:  Filed Vitals:   11/26/14 1815  BP: 108/54  Pulse: 89  Temp:   Resp: 18    Post-op Vital Signs: stable   Complications: No apparent anesthesia complications

## 2014-11-26 NOTE — Transfer of Care (Signed)
Immediate Anesthesia Transfer of Care Note  Patient: Lisa Sanchez  Procedure(s) Performed: Procedure(s): CESAREAN SECTION (N/A)  Patient Location: PACU  Anesthesia Type:Epidural  Level of Consciousness: awake  Airway & Oxygen Therapy: Patient Spontanous Breathing  Post-op Assessment: Report given to RN  Post vital signs: Reviewed and stable  Last Vitals:  Filed Vitals:   11/26/14 1621  BP: 113/56  Pulse: 104  Temp:   Resp:     Complications: No apparent anesthesia complications

## 2014-11-26 NOTE — Progress Notes (Signed)
Continuous FHR surveillance interrupted, RN to bedside, EFM adjusted.  

## 2014-11-26 NOTE — Progress Notes (Signed)
Lisa Sanchez is a 24 y.o. G2P1001 at 4555w0d by ultrasound admitted for induction of labor due to Post dates.  Subjective: Comfortable.    Objective: BP 112/69 mmHg  Pulse 98  Temp(Src) 99.2 F (37.3 C) (Oral)  Resp 18  Ht 5\' 6"  (1.676 m)  Wt 83.915 kg (185 lb)  BMI 29.87 kg/m2  SpO2 98%  LMP 01/23/2014 I/O last 3 completed shifts: In: 690.4 [I.V.:690.4] Out: 3700 [Urine:3700]    FHT:  FHR: 135 bpm, variability: moderate,  accelerations:  Present,  decelerations:  Absent UC:   regular, every 2 minutes SVE:   Dilation: 4.5 Effacement (%): 70 Station: -2 Exam by:: Lisa Sanchez,CNM  Labs: Lab Results  Component Value Date   WBC 9.7 11/25/2014   HGB 9.4* 11/25/2014   HCT 28.7* 11/25/2014   MCV 82.0 11/25/2014   PLT 207 11/25/2014    Assessment / Plan: Induction of labor due to postterm,  progressing well on pitocin  Labor: Prolonged latent phase, slow dilation with cervical swelling Preeclampsia:  n/a Fetal Wellbeing:  Category II Pain Control:  Epidural I/D:  n/a Anticipated MOD:  NSVD  Will continue plan of care. Membranes swept. Placed in upright position for pressure on cervix  Lisa Sanchez,Lisa Sanchez 11/26/2014, 9:26 AM

## 2014-11-26 NOTE — Lactation Note (Signed)
This note was copied from the chart of Lisa Sanchez. Lactation Consultation Note Initial visit at 5 hours of age.  Mom reports baby was taken to nursery to have respirations checked.  Mom is currently feeding her 1920 month old baby and is concerned about how to feed both.  Encouraged mom to feed newborn first and offer both breasts and then allow older sibling to feed as needed.  Encouraged mom to always allow new baby feedings before older sibling.  Mom is able to demonstrate hand expression with colostrum visible.  [facility] resources given and discussed.  Encouraged to feed with early cues on demand.  Early newborn behavior discussed. Mom to call for assist as needed.     Patient Name: Lisa Veatrice BourbonHawi Acheampong AVWUJ'WToday's Date: 11/26/2014 Reason for consult: Initial assessment   Maternal Data Has patient been taught Hand Expression?: Yes Does the patient have breastfeeding experience prior to this delivery?: Yes  Feeding Feeding Type: Breast Fed  LATCH Score/Interventions                      Lactation Tools Discussed/Used     Consult Status Consult Status: Follow-up Date: 11/27/14 Follow-up type: In-patient    Beverely RisenShoptaw, Arvella MerlesJana Lynn 11/26/2014, 10:52 PM

## 2014-11-26 NOTE — Progress Notes (Signed)
Dr. Emelda FearFerguson at bedsde. Plan of care discussed after SVE performed and no cervical change noted. Orders rec'vd to DC Oxytocin infusion and restart at 0300. Pt verbalizes understanding and agrees to plan.

## 2014-11-26 NOTE — Op Note (Signed)
Lisa Sanchez PROCEDURE DATE: 11/25/2014 - 11/26/2014  PREOPERATIVE DIAGNOSIS: Intrauterine pregnancy at  8047w0d weeks gestation; failed TOLAC, previous cesarean  POSTOPERATIVE DIAGNOSIS: The same  PROCEDURE: repeat Low Transverse Cesarean Section  SURGEON:  Dr. Candelaria CelesteJacob Stinson  ASSISTANT: Dr Gerri Sporeaitlin Sullivan  INDICATIONS: Lisa Sanchez is a 24 y.o. [id number] at 347w0d scheduled for cesarean section secondary to failed tolac.  The risks of cesarean section discussed with the patient included but were not limited to: bleeding which may require transfusion or reoperation; infection which may require antibiotics; injury to bowel, bladder, ureters or other surrounding organs; injury to the fetus; need for additional procedures including hysterectomy in the event of a life-threatening hemorrhage; placental abnormalities wth subsequent pregnancies, incisional problems, thromboembolic phenomenon and other postoperative/anesthesia complications. The patient concurred with the proposed plan, giving informed written consent for the procedure.    FINDINGS:  Viable Female infant in vertex presentation.  Apgars 7 and 8, weight pending.  Clear amniotic fluid.  Intact placenta, three vessel cord.  Normal uterus, fallopian tubes and ovaries bilaterally.  ANESTHESIA:    Spinal INTRAVENOUS FLUIDS:2500 ml ESTIMATED BLOOD LOSS: 1000 ml URINE OUTPUT:  300 ml SPECIMENS: Placenta sent to L&D COMPLICATIONS: None immediate  PROCEDURE IN DETAIL:  The patient received intravenous antibiotics and had sequential compression devices applied to her lower extremities while in the preoperative area.  She was then taken to the operating room where epidural anesthesia was dosed up to surgical level and was found to be adequate. She was then placed in a dorsal supine position with a leftward tilt, and prepped and draped in a sterile manner.  A foley catheter had been placed on the labor floor and attached to constant gravity, which  drained pink urine throughout, but cleared.  After an adequate timeout was performed, a Pfannenstiel skin incision was made with scalpel and carried through to the underlying layer of fascia. The fascia was incised in the midline and this incision was extended bilaterally using the Mayo scissors. Kocher clamps were applied to the superior aspect of the fascial incision and the underlying rectus muscles were dissected off bluntly. A similar process was carried out on the inferior aspect of the facial incision. The rectus muscles were separated in the midline bluntly and the peritoneum was entered bluntly. An Alexis retractor was placed to aid in visualization of the uterus.  Attention was turned to the lower uterine segment where a transverse hysterotomy was made with a scalpel and extended bilaterally bluntly. The infant was successfully delivered, and cord was clamped and cut and infant was handed over to awaiting neonatology team. Uterine massage was then administered and the placenta delivered intact with three-vessel cord. The uterus was then cleared of clot and debris.  The hysterotomy was closed with 0 Vicryl in a running locked fashion, and an imbricating layer was also placed with a 0 Vicryl. Overall, excellent hemostasis was noted. The abdomen and the pelvis were cleared of all clot and debris and the Jon Gillslexis was removed. Bleeding was noted from the rectus muscles on the left side.  Two figure of 8 stitches were placed with good hemostasis.  Hemostasis was confirmed on all surfaces.  The fascia was then closed using 0 Vicryl in a running fashion.  The skin was closed with 4-0 vicryl. The patient tolerated the procedure well. Sponge, lap, instrument and needle counts were correct x 2. She was taken to the recovery room in stable condition.    Levie HeritageJacob J Stinson, DO 11/26/2014 5:31 PM

## 2014-11-27 ENCOUNTER — Encounter (HOSPITAL_COMMUNITY): Payer: Self-pay | Admitting: Family Medicine

## 2014-11-27 LAB — CBC
HEMATOCRIT: 22.1 % — AB (ref 36.0–46.0)
HEMATOCRIT: 22.7 % — AB (ref 36.0–46.0)
HEMOGLOBIN: 7.4 g/dL — AB (ref 12.0–15.0)
HEMOGLOBIN: 7.2 g/dL — AB (ref 12.0–15.0)
MCH: 27.6 pg (ref 26.0–34.0)
MCH: 26.6 pg (ref 26.0–34.0)
MCHC: 33.5 g/dL (ref 30.0–36.0)
MCHC: 31.7 g/dL (ref 30.0–36.0)
MCV: 82.5 fL (ref 78.0–100.0)
MCV: 83.8 fL (ref 78.0–100.0)
PLATELETS: 178 10*3/uL (ref 150–400)
Platelets: 189 10*3/uL (ref 150–400)
RBC: 2.68 MIL/uL — ABNORMAL LOW (ref 3.87–5.11)
RBC: 2.71 MIL/uL — ABNORMAL LOW (ref 3.87–5.11)
RDW: 14.7 % (ref 11.5–15.5)
RDW: 14.7 % (ref 11.5–15.5)
WBC: 18.1 10*3/uL — AB (ref 4.0–10.5)
WBC: 14.1 10*3/uL — ABNORMAL HIGH (ref 4.0–10.5)

## 2014-11-27 MED ORDER — FERROUS SULFATE 325 (65 FE) MG PO TABS
325.0000 mg | ORAL_TABLET | Freq: Two times a day (BID) | ORAL | Status: DC
  Administered 2014-11-27 – 2014-11-28 (×3): 325 mg via ORAL
  Filled 2014-11-27 (×3): qty 1

## 2014-11-27 NOTE — Addendum Note (Signed)
Addendum  created 11/27/14 0818 by Shanon PayorSuzanne M Yocelyn Brocious, CRNA   Modules edited: Notes Section   Notes Section:  File: [id number]

## 2014-11-27 NOTE — Lactation Note (Signed)
This note was copied from the chart of Lisa Sanchez. Lactation Consultation Note  Patient Name: Lisa Veatrice BourbonHawi Sanchez ZOXWR'UToday's Date: 11/27/2014 Reason for consult: Follow-up assessment Follow up on a 29 hr old ebf baby. Mom is tandem nursing. RN reports that she has it figured out now and is doing well. Mom was in the bathroom. She will call for bf help as needed.   Maternal Data    Feeding    LATCH Score/Interventions                      Lactation Tools Discussed/Used     Consult Status Consult Status: Follow-up Date: 11/28/14 Follow-up type: In-patient    Rulon Eisenmengerlizabeth E Carlette Palmatier 11/27/2014, 10:51 PM

## 2014-11-27 NOTE — Anesthesia Postprocedure Evaluation (Signed)
  Anesthesia Post-op Note  Patient: Lisa Sanchez  Procedure(s) Performed: Procedure(s): CESAREAN SECTION (N/A)  Patient Location: Mother/Baby  Anesthesia Type:Epidural  Level of Consciousness: awake, alert  and oriented  Airway and Oxygen Therapy: Patient Spontanous Breathing  Post-op Pain: none  Post-op Assessment: Post-op Vital signs reviewed, Patient's Cardiovascular Status Stable, Respiratory Function Stable, Patent Airway, Adequate PO intake, Pain level controlled and No headache              Post-op Vital Signs: Reviewed and stable  Last Vitals:  Filed Vitals:   11/27/14 0630  BP: 98/45  Pulse: 74  Temp: 36.8 C  Resp: 18    Complications: No apparent anesthesia complications

## 2014-11-27 NOTE — Progress Notes (Signed)
Lisa Sanchez Obst is a 24 y.o. [id number] who is POD 1 s/p LTCS at 6920w1d after failed TOLAC d/t arrest of dilation.  Subjective: Patient report doing very well this morning. Pain is controlled with ibuprofen. She has no complaints. Pertinent negatives on ROS include no blurry vision, no headache, no dyspnea, no epigastric or RUQ pain, no nausea or vomiting, no fevers.  Ambulating and tolerating PO without issues. Foley out approx. 1hr ago, no voids yet. She has not noted any flatus. Reports mild vaginal bleeding, which is decreasing.  Objective: Filed Vitals:   11/26/14 2100 11/26/14 2200 11/27/14 0200 11/27/14 0630  BP: 105/56 105/57  98/45  Pulse: 77 84  74  Temp: 98 F (36.7 C) 98.7 F (37.1 C) 98.6 F (37 C) 98.3 F (36.8 C)  TempSrc: Oral Oral Oral Oral  Resp: 20 20 18 18   SpO2: 98% 98% 96% 94%     GEN: alert, comfortable-appearing woman resting in hospital bed, nursing baby. PULM: Mild end-expiratory wheezes diffusely in posterior fields. Otherwise CTA. CV: RRR, S1 and S2 heard, no M/R/G appreciated ABD: fundus firm below umbilicus. Abdomen appropriately TTP. No epigastric of RUQ pain. No guarding. INCISION: Pressure dressing in place. Honeycomb dressing is blood-stained but intact. No erythema, exudate, or active bleeding noted. EXTR: No LE edema or calf tenderness. SCDs in place  Labs: CBC    Component Value Date/Time   WBC 18.1* 11/27/2014 0640   RBC 2.68* 11/27/2014 0640   HGB 7.4* 11/27/2014 0640   HCT 22.1* 11/27/2014 0640   PLT 178 11/27/2014 0640   MCV 82.5 11/27/2014 0640   MCH 27.6 11/27/2014 0640   MCHC 33.5 11/27/2014 0640   RDW 14.7 11/27/2014 0640   Assessment / Plan: Lisa Sanchez Sanjurjo is a 24 y.o. G2P2002 who is POD 1 s/p LTCS at 4720w1d for failure to progress. -- Pain controlled on NSAIDs -- Hemodynamically stable (Hb 9.4 >7.4) -- Tolerating regular diet -- Ambulating without issue -- Due to void around 1300 -- no BM yet, no flatus -- Feeding method:  breast -- Planned contraception method: none  Diet: Regular PPx: SCD stockings in place Code Status: FULL Dispo: Continue routine postpartum care. Will observe another night following c-section.  Gerri Sporeaitlin Sullivan, MD 11/27/2014 7:44 AM  OB fellow attestation:  I have seen and examined this patient; I agree with above documentation in the resident's note.   Federico FlakeKimberly Niles Selinda Korzeniewski, MD 7:58 AM
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
## 2014-11-28 MED ORDER — IBUPROFEN 600 MG PO TABS: 600.0000 mg | ORAL_TABLET | Freq: Four times a day (QID) | ORAL | Status: DC

## 2014-11-28 MED ORDER — OXYCODONE-ACETAMINOPHEN 5-325 MG PO TABS: 1.0000 | ORAL_TABLET | ORAL | Status: DC | PRN

## 2014-11-28 NOTE — Discharge Summary (Signed)
OB Discharge Summary     Patient Name: Lisa Sanchez DOB: July 23, 1990 MRN: [id number]  Date of admission: 11/25/2014 Delivering MD: Levie HeritageSTINSON, JACOB J   Date of discharge: 11/28/2014  Admitting diagnosis: 41wks, induction for post term Intrauterine pregnancy: 7968w0d     Secondary diagnosis:  Active Problems:   Encounter for trial of labor  Additional problems: Previous C/S for failure to progress (in [place])     Discharge diagnosis: Term Pregnancy Delivered                                                                                                Post partum procedures:none  Augmentation: AROM, Pitocin and Foley Balloon  Complications: None  Hospital course:  Induction of Labor With Cesarean Section  24 y.o. yo G2P2002 at 2668w0d was admitted to the hospital 11/25/2014 for induction of labor. Patient had a labor course significant for measures used to achieve active labor, but despite these she remained without cx change in the presence of adequate MVUs. The patient went for cesarean section due to Arrest of Dilation, and delivered a Viable infant on 11/26/14 @ 1655. Membrane Rupture Time/Date: )4:39 PM ,11/25/2014   @Details  of operation can be found in separate operative Note.  Patient had an uncomplicated postpartum course. She is ambulating, tolerating a regular diet, passing flatus, and urinating well.  Patient is discharged home in stable condition on 11/28/14.                                    Physical exam  Filed Vitals:   11/27/14 0630 11/27/14 1030 11/27/14 1744 11/28/14 0601  BP: 98/45 95/46 101/56 105/65  Pulse: 74 78 67   Temp: 98.3 F (36.8 C) 98.2 F (36.8 C) 98.6 F (37 C) 98.8 F (37.1 C)  TempSrc: Oral Oral Oral Oral  Resp: 18 20 18 16   Height:      Weight:      SpO2: 94% 95% 96%    General: alert, cooperative and no distress Lochia: appropriate Uterine Fundus: firm Incision: Dressing is clean, dry, and intact DVT Evaluation: No evidence of DVT  seen on physical exam. Labs: Lab Results  Component Value Date   WBC 14.1* 11/27/2014   HGB 7.2* 11/27/2014   HCT 22.7* 11/27/2014   MCV 83.8 11/27/2014   PLT 189 11/27/2014   No flowsheet data found.  Discharge instruction: per After Visit Summary and "Baby and Me Booklet".  Medications:   Medication List    TAKE these medications        ibuprofen 600 MG tablet  Commonly known as:  ADVIL,MOTRIN  Take 1 tablet (600 mg total) by mouth every 6 (six) hours.     oxyCODONE-acetaminophen 5-325 MG tablet  Commonly known as:  PERCOCET/ROXICET  Take 1 tablet by mouth every 4 (four) hours as needed (for pain scale 4-7).     prenatal multivitamin Tabs tablet  Take 1 tablet by mouth daily at 12 noon.        Diet: routine diet  Activity: Advance as tolerated. Pelvic rest for 6 weeks.   Outpatient follow up:6 weeks Follow up Appt:No future appointments. Follow up Visit:No Follow-up on file.  Postpartum contraception: None  Newborn Data: Live born female  Birth Weight: 9 lb 3.6 oz (4185 g) APGAR: 7, 8  Baby Feeding: Breast Disposition:home with mother   11/28/2014 Cam Hai, CNM  11/28/2014

## 2014-11-28 NOTE — Discharge Instructions (Signed)

## 2014-11-28 NOTE — Progress Notes (Signed)
POSTPARTUM PROGRESS NOTE  Post Partum Day 2 Subjective:  Veatrice BourbonHawi Neidhardt is a 24 y.o. [id number] 7235w0d s/p rltcs for failed tolac (CPD).  No acute events overnight. Difficulty urinating last evening, I/O cath returned 900 cc urine. This morning able to spontaneously void 300 cc.  Pt denies problems with ambulating, voiding or po intake.  She denies nausea or vomiting.  Pain is well controlled.  She has had flatus. She has not had bowel movement.  Lochia Small.   Objective: Blood pressure 105/65, pulse 67, temperature 98.8 F (37.1 C), temperature source Oral, resp. rate 16, height 5\' 6"  (1.676 m), weight 185 lb (83.915 kg), last menstrual period 01/23/2014, SpO2 96 %, unknown if currently breastfeeding.  Physical Exam:  General: alert, cooperative and no distress Lochia:normal flow Chest: CTAB Heart: RRR no m/r/g Abdomen: +BS, soft, nontender,  Uterine Fundus: firm Incision: c/d/i DVT Evaluation: No calf swelling or tenderness Extremities: trace edema   Recent Labs  11/27/14 0640 11/27/14 1755  HGB 7.4* 7.2*  HCT 22.1* 22.7*    Assessment/Plan:  ASSESSMENT: Veatrice BourbonHawi Domine is a 24 y.o. [id number] 1035w0d s/p rltcs. Last night with difficulty voiding. Will monitor today and will bladder scan after next void to ensure patient is voiding adequately. If so patient would like PM discharge. Otherwise doing well. We have started PO iron for H 7.2 after surgery. Currently asymptomatic.     LOS: 3 days   Silvano Bilisoah B Tamecca Artiga 11/28/2014, 8:23 AM

## 2014-12-12 ENCOUNTER — Emergency Department (HOSPITAL_COMMUNITY): Admission: EM | Admit: 2014-12-12 | Discharge: 2014-12-12 | Payer: Self-pay | Source: Home / Self Care

## 2015-03-03 ENCOUNTER — Encounter (HOSPITAL_COMMUNITY): Payer: Self-pay | Admitting: Neurology

## 2015-03-03 ENCOUNTER — Emergency Department (HOSPITAL_COMMUNITY)
Admission: EM | Admit: 2015-03-03 | Discharge: 2015-03-03 | Disposition: A | Discharge status: Home / Self Care | Payer: Self-pay | Attending: Emergency Medicine | Admitting: Emergency Medicine

## 2015-03-03 DIAGNOSIS — Z8659 Personal history of other mental and behavioral disorders: Secondary | ICD-10-CM | POA: Insufficient documentation

## 2015-03-03 DIAGNOSIS — N61 Mastitis without abscess: Secondary | ICD-10-CM | POA: Insufficient documentation

## 2015-03-03 DIAGNOSIS — Z79899 Other long term (current) drug therapy: Secondary | ICD-10-CM | POA: Insufficient documentation

## 2015-03-03 MED ORDER — CEPHALEXIN 500 MG PO CAPS: 500.0000 mg | ORAL_CAPSULE | Freq: Four times a day (QID) | ORAL | Status: DC

## 2015-03-03 NOTE — Discharge Instructions (Signed)
You have mastitis.  See below for more information.  Take Ibuprofen  every 6 hours for the next several days, then you can take as needed.  Use ice packs as needed.  Continue to empty your breast regularly with pumping and breastfeeding.  Start taking antibiotic (Keflex) today, four times daily, for the next 10 days.  Take entire course of antibiotics even if you are feeling better.  If you are not better or doing worse in the next 2 days, go to Maternity Admissions at [facility] or your OB.  Mastitis  Mastitis is redness, soreness, and puffiness (inflammation) in an area of the breast. It is often caused by an infection that occurs when bacteria enter the skin. The infection is often helped by antibiotic medicine. HOME CARE  Only take medicines as told by your doctor.  If your doctor prescribed an antibiotic medicine, take it as told. Finish it even if you start to feel better.  Do not wear a tight or underwire bra. Wear a soft support bra.  Drink more fluids, especially if you have a fever.  If you are breastfeeding:  Keep emptying the breast. Your doctor can tell you if the milk is safe. Use a breast pump if you are told to stop nursing.  Keep your nipples clean and dry.  Empty the first breast before going to the other breast. Use a breast pump if your baby is not emptying your breast.  If you go back to work, pump your breasts while at work.  Avoid letting your breasts get overly filled with milk (engorged). GET HELP IF:  You have pus-like fluid leaking from your breast.  Your symptoms do not get better within 2 days. GET HELP RIGHT AWAY IF:  Your pain and puffiness are getting worse.  Your pain is not helped by medicine.  You have a red line going from your breast toward your armpit.  You have a fever or lasting symptoms for more than 2-3 days.  You have a fever and your symptoms suddenly get worse. MAKE SURE YOU:   Understand these instructions.  Will  watch your condition.  Will get help right away if you are not doing well or get worse.   This information is not intended to replace advice given to you by your health care provider. Make sure you discuss any questions you have with your health care provider.   Document Released: 01/07/2009 Document Revised: 09/21/2012 Document Reviewed: 08/19/2012 Elsevier Interactive Patient Education [employer]
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
## 2015-03-03 NOTE — ED Notes (Signed)
Pt c/o left sided breast pain since yesterday with low grade fevers. Is breast feeding.

## 2015-07-31 ENCOUNTER — Encounter (HOSPITAL_COMMUNITY): Payer: Self-pay | Admitting: Emergency Medicine

## 2015-07-31 ENCOUNTER — Ambulatory Visit (HOSPITAL_COMMUNITY)
Admission: EM | Admit: 2015-07-31 | Discharge: 2015-07-31 | Disposition: A | Discharge status: Home / Self Care | Payer: Medicaid Other | Attending: Family Medicine | Admitting: Family Medicine

## 2015-07-31 DIAGNOSIS — B029 Zoster without complications: Secondary | ICD-10-CM

## 2015-07-31 MED ORDER — LIDOCAINE 5 % EX PTCH: 1.0000 | MEDICATED_PATCH | CUTANEOUS | Status: DC

## 2015-07-31 MED ORDER — VALACYCLOVIR HCL 1 G PO TABS: 1000.0000 mg | ORAL_TABLET | Freq: Three times a day (TID) | ORAL | Status: DC

## 2015-07-31 NOTE — Discharge Instructions (Signed)

## 2015-07-31 NOTE — ED Notes (Signed)
Patient did not tell caregiver she was breast feeding until she told staff she needed to hurry to go breast feed her baby.  Notified provider.  Reevaluation of plan of care and medication safety in breast feeding mother.  Child is 828 months old

## 2015-07-31 NOTE — ED Notes (Signed)
Patient has a rash across left breast, noticed 2 days ago.  Reports areas of rash on back.  Red, fine rash visible on left chest
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
## 2017-07-19 ENCOUNTER — Ambulatory Visit (HOSPITAL_COMMUNITY)
Admission: RE | Admit: 2017-07-19 | Discharge: 2017-07-19 | Disposition: A | Discharge status: Home / Self Care | Payer: Self-pay | Source: Ambulatory Visit | Attending: Occupational Medicine | Admitting: Occupational Medicine

## 2017-07-19 ENCOUNTER — Other Ambulatory Visit: Payer: Self-pay | Admitting: Occupational Medicine

## 2017-07-19 DIAGNOSIS — R7611 Nonspecific reaction to tuberculin skin test without active tuberculosis: Secondary | ICD-10-CM

## 2018-01-18 ENCOUNTER — Other Ambulatory Visit (HOSPITAL_COMMUNITY)
Admission: RE | Admit: 2018-01-18 | Discharge: 2018-01-18 | Disposition: A | Discharge status: Home / Self Care | Payer: No Typology Code available for payment source | Source: Ambulatory Visit | Attending: Family Medicine | Admitting: Family Medicine

## 2018-01-18 ENCOUNTER — Other Ambulatory Visit: Payer: Self-pay | Admitting: Family Medicine

## 2018-01-18 DIAGNOSIS — Z124 Encounter for screening for malignant neoplasm of cervix: Secondary | ICD-10-CM | POA: Diagnosis present

## 2018-01-19 ENCOUNTER — Other Ambulatory Visit: Payer: Self-pay | Admitting: Family Medicine

## 2018-01-19 DIAGNOSIS — N649 Disorder of breast, unspecified: Secondary | ICD-10-CM

## 2018-01-21 LAB — CYTOLOGY - PAP
BACTERIAL VAGINITIS: NEGATIVE
CANDIDA VAGINITIS: NEGATIVE
DIAGNOSIS: NEGATIVE
HPV (WINDOPATH): NOT DETECTED
Trichomonas: NEGATIVE

## 2018-02-08 ENCOUNTER — Other Ambulatory Visit: Payer: Self-pay | Admitting: Family Medicine

## 2018-02-08 DIAGNOSIS — N649 Disorder of breast, unspecified: Secondary | ICD-10-CM

## 2018-02-10 ENCOUNTER — Other Ambulatory Visit: Payer: No Typology Code available for payment source

## 2018-02-15 ENCOUNTER — Ambulatory Visit
Admission: RE | Admit: 2018-02-15 | Discharge: 2018-02-15 | Disposition: A | Discharge status: Home / Self Care | Payer: No Typology Code available for payment source | Source: Ambulatory Visit | Attending: Family Medicine | Admitting: Family Medicine

## 2018-02-15 ENCOUNTER — Encounter: Payer: Self-pay | Admitting: Radiology

## 2018-02-15 ENCOUNTER — Other Ambulatory Visit: Payer: Self-pay | Admitting: Family Medicine

## 2018-02-15 DIAGNOSIS — N649 Disorder of breast, unspecified: Secondary | ICD-10-CM

## 2018-07-04 ENCOUNTER — Inpatient Hospital Stay (HOSPITAL_COMMUNITY)
Admission: AD | Admit: 2018-07-04 | Discharge: 2018-07-04 | Disposition: A | Discharge status: Home / Self Care | Payer: No Typology Code available for payment source | Attending: Obstetrics and Gynecology | Admitting: Obstetrics and Gynecology

## 2018-07-04 ENCOUNTER — Encounter (HOSPITAL_COMMUNITY): Payer: Self-pay | Admitting: *Deleted

## 2018-07-04 ENCOUNTER — Other Ambulatory Visit: Payer: Self-pay

## 2018-07-04 DIAGNOSIS — O368120 Decreased fetal movements, second trimester, not applicable or unspecified: Secondary | ICD-10-CM | POA: Diagnosis not present

## 2018-07-04 DIAGNOSIS — Z3A19 19 weeks gestation of pregnancy: Secondary | ICD-10-CM | POA: Insufficient documentation

## 2018-07-04 NOTE — Discharge Instructions (Signed)

## 2018-07-04 NOTE — MAU Note (Signed)
This is er 3rd baby, is 19wk and hasn't felt the baby move,  Is concerned because she felt the others move sooner. No bleeding, d/c or pain. Has appt set for next wk.

## 2018-07-04 NOTE — MAU Provider Note (Signed)
First Provider Initiated Contact with Patient 07/04/18 1440      S Ms. Genora Claycamp is a 28 y.o. G42P2002 non-pregnant female who presents to MAU today with complaint of DFM. Pt reports with her other children she felt movement earlier than 19 weeks. Pt has not yet had an anatomy scan. Pt denies VB, abdominal pain or pelvic pain. Pt has not yet been seen by an OB provider and reports her first visit is a week from today.  O BP 108/66 (BP Location: Right Arm)   Pulse 80   Temp 98 F (36.7 C) (Oral)   Resp 18   Wt 75.7 kg   LMP 01/24/2018 (Exact Date)   SpO2 100%   BMI 26.94 kg/m  Physical Exam  Constitutional: She is oriented to person, place, and time. She appears well-developed and well-nourished. No distress.  HENT:  Head: Normocephalic and atraumatic.  Respiratory: Effort normal.  Neurological: She is alert and oriented to person, place, and time.  Skin: She is not diaphoretic.  Psychiatric: She has a normal mood and affect. Her behavior is normal. Judgment and thought content normal.  FHB 156  A Pregnant female @[redacted]w[redacted]d  by LMP Medical screening exam complete  P Discharge from MAU in stable condition Discussed normal FM across gestational age Discussed FKC starting @28wks  Discussed imfluence of placental location on feeling fetal movement Warning signs for worsening condition that would warrant emergency follow-up discussed Patient may return to MAU as needed for pregnancy related complaints  Lekendrick Alpern, Odie Sera, NP 07/04/2018 3:04 PM

## 2018-07-12 LAB — OB RESULTS CONSOLE ABO/RH: RH Type: POSITIVE

## 2018-07-12 LAB — OB RESULTS CONSOLE HEPATITIS B SURFACE ANTIGEN: Hepatitis B Surface Ag: NEGATIVE

## 2018-07-12 LAB — OB RESULTS CONSOLE RPR: RPR: NONREACTIVE

## 2018-07-12 LAB — OB RESULTS CONSOLE HIV ANTIBODY (ROUTINE TESTING): HIV: NONREACTIVE

## 2018-07-12 LAB — OB RESULTS CONSOLE RUBELLA ANTIBODY, IGM: Rubella: IMMUNE

## 2018-07-12 LAB — OB RESULTS CONSOLE GC/CHLAMYDIA
Chlamydia: NEGATIVE
Gonorrhea: NEGATIVE

## 2018-07-12 LAB — OB RESULTS CONSOLE ANTIBODY SCREEN: Antibody Screen: NEGATIVE

## 2018-11-08 ENCOUNTER — Encounter (HOSPITAL_COMMUNITY): Payer: Self-pay

## 2018-11-08 LAB — OB RESULTS CONSOLE GBS: GBS: NEGATIVE

## 2018-11-08 NOTE — Patient Instructions (Addendum)
Lisa Sanchez  11/08/2018   Your procedure is scheduled on:  11/22/2018  Arrive at 53 at Entrance C on [address] [facility]  and [employer]. You are invited to use the FREE valet parking or use the Visitor's parking deck.  Pick up the phone at the desk and dial [phone].  Call this number if you have problems the morning of surgery: [phone]  Remember:   Do not eat food:(After Midnight) Desps de medianoche.  Do not drink clear liquids: (After Midnight) Desps de medianoche.  Take these medicines the morning of surgery with A SIP OF WATER:  none   Do not wear jewelry, make-up or nail polish.  Do not wear lotions, powders, or perfumes. Do not wear deodorant.  Do not shave 48 hours prior to surgery.  Do not bring valuables to the hospital.  [facility] is not   responsible for any belongings or valuables brought to the hospital.  Contacts, dentures or bridgework may not be worn into surgery.  Leave suitcase in the car. After surgery it may be brought to your room.  For patients admitted to the hospital, checkout time is 11:00 AM the day of              discharge.      Please read over the following fact sheets that you were given:     Preparing for Surgery

## 2018-11-20 ENCOUNTER — Other Ambulatory Visit: Payer: Self-pay

## 2018-11-20 ENCOUNTER — Other Ambulatory Visit (HOSPITAL_COMMUNITY)
Admission: RE | Admit: 2018-11-20 | Discharge: 2018-11-20 | Disposition: A | Discharge status: Home / Self Care | Payer: No Typology Code available for payment source | Source: Ambulatory Visit | Attending: Obstetrics & Gynecology | Admitting: Obstetrics & Gynecology

## 2018-11-20 DIAGNOSIS — Z20828 Contact with and (suspected) exposure to other viral communicable diseases: Secondary | ICD-10-CM | POA: Insufficient documentation

## 2018-11-20 DIAGNOSIS — Z01812 Encounter for preprocedural laboratory examination: Secondary | ICD-10-CM | POA: Insufficient documentation

## 2018-11-20 LAB — TYPE AND SCREEN
ABO/RH(D): O POS
Antibody Screen: NEGATIVE

## 2018-11-20 LAB — CBC
HCT: 32.8 % — ABNORMAL LOW (ref 36.0–46.0)
Hemoglobin: 11.2 g/dL — ABNORMAL LOW (ref 12.0–15.0)
MCH: 29.8 pg (ref 26.0–34.0)
MCHC: 34.1 g/dL (ref 30.0–36.0)
MCV: 87.2 fL (ref 80.0–100.0)
Platelets: 163 10*3/uL (ref 150–400)
RBC: 3.76 MIL/uL — ABNORMAL LOW (ref 3.87–5.11)
RDW: 13.2 % (ref 11.5–15.5)
WBC: 8.7 10*3/uL (ref 4.0–10.5)
nRBC: 0 % (ref 0.0–0.2)

## 2018-11-20 LAB — ABO/RH: ABO/RH(D): O POS

## 2018-11-20 LAB — SARS CORONAVIRUS 2 BY RT PCR (HOSPITAL ORDER, PERFORMED IN ~~LOC~~ HOSPITAL LAB): SARS Coronavirus 2: NEGATIVE

## 2018-11-20 LAB — RPR: RPR Ser Ql: NONREACTIVE

## 2018-11-20 NOTE — MAU Note (Signed)
Pt here for PAT covid swab and lab draw. Denies symptoms. Swab collected. Pt verbalizes understanding to not remove blood bank bracelet.  

## 2018-11-21 NOTE — H&P (Signed)
HPI: 28 y/o G3P2002 @ [redacted]w[redacted]d estimated gestational age (as dated by LMP c/w 20 week ultrasound) presents for scheduled repeat C-section.   no Leaking of Fluid,   no Vaginal Bleeding,   no Uterine Contractions,  = Fetal Movement.  Prenatal care has been provided by Dr. Charlotta Newton  ROS: no HA, no epigastric pain, no visual changes.    Pregnancy complicated by: -Prior C-section x 2   Prenatal Transfer Tool  Maternal Diabetes: No Genetic Screening: Normal Maternal Ultrasounds/Referrals: Normal Fetal Ultrasounds or other Referrals:  None Maternal Substance Abuse:  No Significant Maternal Medications:  None Significant Maternal Lab Results: Group B Strep negative   PNL:  GBS neg, Rub Immune, Hep B neg, RPR NR, HIV neg, GC/C neg, glucola:94 Blood type: O positive, antibody neg  Immunizations: Tdap: 8/21 Flu: outside facility  OBHx: 2014, 2016- C-sections PMHx:  none Meds:  PNV Allergy:  No Known Allergies SurgHx: C-section x2 SocHx:   no Tobacco, no  EtOH, no Illicit Drugs  O: LMP 01/24/2018 (Exact Date)  To be obtained on arrival  Gen. AAOx3, NAD CV.  RRR  No murmur.  Resp. CTAB, no wheeze or crackles. Abd. Gravid,  no tenderness,  no rigidity,  no guarding Extr.  no edema B/L , no calf tenderness, neg Homan's B/L  FHT: 135 by doppler on 10/16 Last US@ 20wk: vertex/post/female, normal anatomy   Labs:  Results for orders placed or performed during the hospital encounter of 11/20/18 (from the past 24 hour(s))  SARS Coronavirus 2 by RT PCR (hospital order, performed in [facility] hospital lab) Nasopharyngeal Nasopharyngeal Swab     Status: None   Collection Time: 11/20/18  8:22 AM   Specimen: Nasopharyngeal Swab  Result Value Ref Range   SARS Coronavirus 2 NEGATIVE NEGATIVE  CBC     Status: Abnormal   Collection Time: 11/20/18  8:39 AM  Result Value Ref Range   WBC 8.7 4.0 - 10.5 K/uL   RBC 3.76 (L) 3.87 - 5.11 MIL/uL   Hemoglobin 11.2 (L) 12.0 - 15.0 g/dL   HCT 09.3 (L)  23.5 - 46.0 %   MCV 87.2 80.0 - 100.0 fL   MCH 29.8 26.0 - 34.0 pg   MCHC 34.1 30.0 - 36.0 g/dL   RDW 57.3 22.0 - 25.4 %   Platelets 163 150 - 400 K/uL   nRBC 0.0 0.0 - 0.2 %  Type and screen [facility]     Status: None   Collection Time: 11/20/18  8:39 AM  Result Value Ref Range   ABO/RH(D) O POS    Antibody Screen NEG    Sample Expiration      11/23/2018,2359 Performed at [facility], [address], [place], [place] [address]   RPR     Status: None   Collection Time: 11/20/18  8:39 AM  Result Value Ref Range   RPR Ser Ql NON REACTIVE NON REACTIVE  ABO/Rh     Status: None   Collection Time: 11/20/18  8:39 AM  Result Value Ref Range   ABO/RH(D)      O POS Performed at [facility], [address] [place], [place] [address]     A/P:  28 y.o. [phone] @ [redacted]w[redacted]d EGA who presents for scheduled repeat C-section due to prior C-section x 2 -FWB:  Reassuring by doppler -NPO -LR @ 125cc/hr -SCDs to OR -Ancef 2g IV -Risk benefits and alternatives of cesarean section were discussed with the patient  including but not limited to infection, bleeding, damage to bowel , bladder and baby with the need for further surgery. Pt voiced understanding and desires to proceed.   Janyth Pupa, DO [phone] (cell) [phone] (office)

## 2018-11-22 ENCOUNTER — Inpatient Hospital Stay (HOSPITAL_COMMUNITY): Payer: No Typology Code available for payment source | Admitting: Certified Registered Nurse Anesthetist

## 2018-11-22 ENCOUNTER — Encounter (HOSPITAL_COMMUNITY)
Admission: RE | Disposition: A | Discharge status: Home / Self Care | Payer: Self-pay | Source: Home / Self Care | Attending: Obstetrics & Gynecology

## 2018-11-22 ENCOUNTER — Other Ambulatory Visit: Payer: Self-pay

## 2018-11-22 ENCOUNTER — Inpatient Hospital Stay (HOSPITAL_COMMUNITY)
Admission: RE | Admit: 2018-11-22 | Discharge: 2018-11-25 | DRG: 788 | Disposition: A | Discharge status: Home / Self Care | Payer: No Typology Code available for payment source | Attending: Obstetrics & Gynecology | Admitting: Obstetrics & Gynecology

## 2018-11-22 ENCOUNTER — Encounter (HOSPITAL_COMMUNITY): Payer: Self-pay | Admitting: *Deleted

## 2018-11-22 DIAGNOSIS — O34211 Maternal care for low transverse scar from previous cesarean delivery: Secondary | ICD-10-CM | POA: Diagnosis present

## 2018-11-22 DIAGNOSIS — Z20828 Contact with and (suspected) exposure to other viral communicable diseases: Secondary | ICD-10-CM | POA: Diagnosis present

## 2018-11-22 DIAGNOSIS — Z3493 Encounter for supervision of normal pregnancy, unspecified, third trimester: Secondary | ICD-10-CM

## 2018-11-22 DIAGNOSIS — Z3A39 39 weeks gestation of pregnancy: Secondary | ICD-10-CM | POA: Diagnosis not present

## 2018-11-22 SURGERY — Surgical Case
Anesthesia: Spinal

## 2018-11-22 MED ORDER — DEXAMETHASONE SODIUM PHOSPHATE 4 MG/ML IJ SOLN
INTRAMUSCULAR | Status: DC | PRN
  Administered 2018-11-22: 10 mg via INTRAVENOUS

## 2018-11-22 MED ORDER — OXYTOCIN 40 UNITS IN NORMAL SALINE INFUSION - SIMPLE MED: 2.5000 [IU]/h | INTRAVENOUS | Status: AC

## 2018-11-22 MED ORDER — FENTANYL CITRATE (PF) 100 MCG/2ML IJ SOLN
INTRAMUSCULAR | Status: DC | PRN
  Administered 2018-11-22: 15 ug via INTRATHECAL

## 2018-11-22 MED ORDER — NALBUPHINE HCL 10 MG/ML IJ SOLN
5.0000 mg | INTRAMUSCULAR | Status: DC | PRN
  Filled 2018-11-22: qty 0.5

## 2018-11-22 MED ORDER — PHENYLEPHRINE HCL (PRESSORS) 10 MG/ML IV SOLN
INTRAVENOUS | Status: DC | PRN
  Administered 2018-11-22: 40 ug via INTRAVENOUS
  Administered 2018-11-22: 80 ug via INTRAVENOUS

## 2018-11-22 MED ORDER — KETOROLAC TROMETHAMINE 30 MG/ML IJ SOLN
30.0000 mg | Freq: Four times a day (QID) | INTRAMUSCULAR | Status: AC
  Administered 2018-11-22 – 2018-11-23 (×4): 30 mg via INTRAVENOUS
  Filled 2018-11-22 (×4): qty 1

## 2018-11-22 MED ORDER — FENTANYL CITRATE (PF) 100 MCG/2ML IJ SOLN
INTRAMUSCULAR | Status: AC
  Filled 2018-11-22: qty 2

## 2018-11-22 MED ORDER — PHENYLEPHRINE 40 MCG/ML (10ML) SYRINGE FOR IV PUSH (FOR BLOOD PRESSURE SUPPORT)
PREFILLED_SYRINGE | INTRAVENOUS | Status: AC
  Filled 2018-11-22: qty 10

## 2018-11-22 MED ORDER — SIMETHICONE 80 MG PO CHEW: 80.0000 mg | CHEWABLE_TABLET | ORAL | Status: DC | PRN

## 2018-11-22 MED ORDER — NALBUPHINE HCL 10 MG/ML IJ SOLN
5.0000 mg | Freq: Once | INTRAMUSCULAR | Status: DC | PRN
  Filled 2018-11-22: qty 0.5

## 2018-11-22 MED ORDER — BUPIVACAINE IN DEXTROSE 0.75-8.25 % IT SOLN
INTRATHECAL | Status: DC | PRN
  Administered 2018-11-22: 1.6 mL via INTRATHECAL

## 2018-11-22 MED ORDER — OXYCODONE HCL 5 MG PO TABS: 5.0000 mg | ORAL_TABLET | Freq: Once | ORAL | Status: DC | PRN

## 2018-11-22 MED ORDER — OXYTOCIN 40 UNITS IN NORMAL SALINE INFUSION - SIMPLE MED
INTRAVENOUS | Status: AC
  Filled 2018-11-22: qty 1000

## 2018-11-22 MED ORDER — OXYCODONE HCL 5 MG PO TABS: 5.0000 mg | ORAL_TABLET | ORAL | Status: DC | PRN

## 2018-11-22 MED ORDER — MORPHINE SULFATE (PF) 0.5 MG/ML IJ SOLN
INTRAMUSCULAR | Status: DC | PRN
  Administered 2018-11-22: .15 mg via INTRATHECAL

## 2018-11-22 MED ORDER — SCOPOLAMINE 1 MG/3DAYS TD PT72
MEDICATED_PATCH | TRANSDERMAL | Status: DC | PRN
  Administered 2018-11-22: 1 via TRANSDERMAL

## 2018-11-22 MED ORDER — DIPHENHYDRAMINE HCL 25 MG PO CAPS: 25.0000 mg | ORAL_CAPSULE | Freq: Four times a day (QID) | ORAL | Status: DC | PRN

## 2018-11-22 MED ORDER — ONDANSETRON HCL 4 MG/2ML IJ SOLN
INTRAMUSCULAR | Status: DC | PRN
  Administered 2018-11-22: 4 mg via INTRAVENOUS

## 2018-11-22 MED ORDER — PHENYLEPHRINE HCL-NACL 20-0.9 MG/250ML-% IV SOLN
INTRAVENOUS | Status: AC
  Filled 2018-11-22: qty 250

## 2018-11-22 MED ORDER — SENNOSIDES-DOCUSATE SODIUM 8.6-50 MG PO TABS
2.0000 | ORAL_TABLET | ORAL | Status: DC
  Administered 2018-11-23: 2 via ORAL
  Filled 2018-11-22 (×3): qty 2

## 2018-11-22 MED ORDER — COCONUT OIL OIL
1.0000 "application " | TOPICAL_OIL | Status: DC | PRN
  Administered 2018-11-24: 1 via TOPICAL

## 2018-11-22 MED ORDER — KETOROLAC TROMETHAMINE 30 MG/ML IJ SOLN
INTRAMUSCULAR | Status: AC
  Filled 2018-11-22: qty 1

## 2018-11-22 MED ORDER — PRENATAL MULTIVITAMIN CH
1.0000 | ORAL_TABLET | Freq: Every day | ORAL | Status: DC
  Administered 2018-11-23 – 2018-11-24 (×2): 1 via ORAL
  Filled 2018-11-22 (×2): qty 1

## 2018-11-22 MED ORDER — SIMETHICONE 80 MG PO CHEW
80.0000 mg | CHEWABLE_TABLET | ORAL | Status: DC
  Administered 2018-11-23 – 2018-11-25 (×2): 80 mg via ORAL
  Filled 2018-11-22 (×3): qty 1

## 2018-11-22 MED ORDER — OXYTOCIN 40 UNITS IN NORMAL SALINE INFUSION - SIMPLE MED
INTRAVENOUS | Status: DC | PRN
  Administered 2018-11-22: 40 [IU] via INTRAVENOUS

## 2018-11-22 MED ORDER — DIPHENHYDRAMINE HCL 25 MG PO CAPS: 25.0000 mg | ORAL_CAPSULE | ORAL | Status: DC | PRN

## 2018-11-22 MED ORDER — SOD CITRATE-CITRIC ACID 500-334 MG/5ML PO SOLN
ORAL | Status: AC
  Filled 2018-11-22: qty 30

## 2018-11-22 MED ORDER — PHENYLEPHRINE HCL-NACL 20-0.9 MG/250ML-% IV SOLN
INTRAVENOUS | Status: DC | PRN
  Administered 2018-11-22: 60 ug/min via INTRAVENOUS

## 2018-11-22 MED ORDER — NALOXONE HCL 0.4 MG/ML IJ SOLN: 0.4000 mg | INTRAMUSCULAR | Status: DC | PRN

## 2018-11-22 MED ORDER — LACTATED RINGERS IV SOLN
INTRAVENOUS | Status: DC
  Administered 2018-11-22 (×3): via INTRAVENOUS

## 2018-11-22 MED ORDER — SODIUM CHLORIDE 0.9 % IV SOLN
INTRAVENOUS | Status: DC | PRN
  Administered 2018-11-22: 13:00:00 via INTRAVENOUS

## 2018-11-22 MED ORDER — MORPHINE SULFATE (PF) 0.5 MG/ML IJ SOLN
INTRAMUSCULAR | Status: AC
  Filled 2018-11-22: qty 10

## 2018-11-22 MED ORDER — DIPHENHYDRAMINE HCL 50 MG/ML IJ SOLN: 12.5000 mg | INTRAMUSCULAR | Status: DC | PRN

## 2018-11-22 MED ORDER — SOD CITRATE-CITRIC ACID 500-334 MG/5ML PO SOLN
30.0000 mL | ORAL | Status: AC
  Administered 2018-11-22: 30 mL via ORAL

## 2018-11-22 MED ORDER — DEXAMETHASONE SODIUM PHOSPHATE 10 MG/ML IJ SOLN
INTRAMUSCULAR | Status: AC
  Filled 2018-11-22: qty 1

## 2018-11-22 MED ORDER — WITCH HAZEL-GLYCERIN EX PADS: 1.0000 "application " | MEDICATED_PAD | CUTANEOUS | Status: DC | PRN

## 2018-11-22 MED ORDER — ACETAMINOPHEN 500 MG PO TABS
1000.0000 mg | ORAL_TABLET | Freq: Four times a day (QID) | ORAL | Status: DC
  Administered 2018-11-22 – 2018-11-25 (×10): 1000 mg via ORAL
  Filled 2018-11-22 (×10): qty 2

## 2018-11-22 MED ORDER — SODIUM CHLORIDE 0.9% FLUSH: 3.0000 mL | INTRAVENOUS | Status: DC | PRN

## 2018-11-22 MED ORDER — ONDANSETRON HCL 4 MG/2ML IJ SOLN
INTRAMUSCULAR | Status: AC
  Filled 2018-11-22: qty 2

## 2018-11-22 MED ORDER — CEFAZOLIN SODIUM-DEXTROSE 2-4 GM/100ML-% IV SOLN
INTRAVENOUS | Status: AC
  Filled 2018-11-22: qty 100

## 2018-11-22 MED ORDER — MEPERIDINE HCL 25 MG/ML IJ SOLN: 6.2500 mg | INTRAMUSCULAR | Status: DC | PRN

## 2018-11-22 MED ORDER — SIMETHICONE 80 MG PO CHEW
80.0000 mg | CHEWABLE_TABLET | Freq: Three times a day (TID) | ORAL | Status: DC
  Administered 2018-11-22 – 2018-11-24 (×5): 80 mg via ORAL
  Filled 2018-11-22 (×6): qty 1

## 2018-11-22 MED ORDER — DIBUCAINE (PERIANAL) 1 % EX OINT: 1.0000 "application " | TOPICAL_OINTMENT | CUTANEOUS | Status: DC | PRN

## 2018-11-22 MED ORDER — MENTHOL 3 MG MT LOZG: 1.0000 | LOZENGE | OROMUCOSAL | Status: DC | PRN

## 2018-11-22 MED ORDER — SCOPOLAMINE 1 MG/3DAYS TD PT72
MEDICATED_PATCH | TRANSDERMAL | Status: AC
  Filled 2018-11-22: qty 1

## 2018-11-22 MED ORDER — ZOLPIDEM TARTRATE 5 MG PO TABS: 5.0000 mg | ORAL_TABLET | Freq: Every evening | ORAL | Status: DC | PRN

## 2018-11-22 MED ORDER — ONDANSETRON HCL 4 MG/2ML IJ SOLN: 4.0000 mg | Freq: Once | INTRAMUSCULAR | Status: DC | PRN

## 2018-11-22 MED ORDER — LACTATED RINGERS IV SOLN
INTRAVENOUS | Status: DC
  Administered 2018-11-22 – 2018-11-23 (×2): via INTRAVENOUS

## 2018-11-22 MED ORDER — FENTANYL CITRATE (PF) 100 MCG/2ML IJ SOLN
25.0000 ug | INTRAMUSCULAR | Status: DC | PRN
  Administered 2018-11-22: 50 ug via INTRAVENOUS

## 2018-11-22 MED ORDER — ONDANSETRON HCL 4 MG/2ML IJ SOLN: 4.0000 mg | Freq: Three times a day (TID) | INTRAMUSCULAR | Status: DC | PRN

## 2018-11-22 MED ORDER — IBUPROFEN 600 MG PO TABS
600.0000 mg | ORAL_TABLET | Freq: Four times a day (QID) | ORAL | Status: DC
  Administered 2018-11-23 – 2018-11-25 (×7): 600 mg via ORAL
  Filled 2018-11-22 (×7): qty 1

## 2018-11-22 MED ORDER — CEFAZOLIN SODIUM-DEXTROSE 2-4 GM/100ML-% IV SOLN
2.0000 g | INTRAVENOUS | Status: AC
  Administered 2018-11-22: 2 g via INTRAVENOUS

## 2018-11-22 MED ORDER — KETOROLAC TROMETHAMINE 30 MG/ML IJ SOLN
30.0000 mg | Freq: Once | INTRAMUSCULAR | Status: AC | PRN
  Administered 2018-11-22: 14:00:00 30 mg via INTRAVENOUS

## 2018-11-22 MED ORDER — OXYCODONE HCL 5 MG/5ML PO SOLN: 5.0000 mg | Freq: Once | ORAL | Status: DC | PRN

## 2018-11-22 MED ORDER — NALOXONE HCL 4 MG/10ML IJ SOLN
1.0000 ug/kg/h | INTRAVENOUS | Status: DC | PRN
  Filled 2018-11-22: qty 5

## 2018-11-22 MED ORDER — SCOPOLAMINE 1 MG/3DAYS TD PT72: 1.0000 | MEDICATED_PATCH | Freq: Once | TRANSDERMAL | Status: DC

## 2018-11-22 SURGICAL SUPPLY — 42 items
BARRIER ADHS 3X4 INTERCEED (GAUZE/BANDAGES/DRESSINGS) ×2 IMPLANT
BENZOIN TINCTURE PRP APPL 2/3 (GAUZE/BANDAGES/DRESSINGS) ×3 IMPLANT
CHLORAPREP W/TINT 26ML (MISCELLANEOUS) ×3 IMPLANT
CLAMP CORD UMBIL (MISCELLANEOUS) IMPLANT
CLOSURE WOUND 1/2 X4 (GAUZE/BANDAGES/DRESSINGS) ×1
CLOTH BEACON ORANGE TIMEOUT ST (SAFETY) ×3 IMPLANT
DERMABOND ADVANCED (GAUZE/BANDAGES/DRESSINGS)
DERMABOND ADVANCED .7 DNX12 (GAUZE/BANDAGES/DRESSINGS) IMPLANT
DRSG OPSITE POSTOP 4X10 (GAUZE/BANDAGES/DRESSINGS) ×3 IMPLANT
ELECT REM PT RETURN 9FT ADLT (ELECTROSURGICAL) ×3
ELECTRODE REM PT RTRN 9FT ADLT (ELECTROSURGICAL) ×1 IMPLANT
EXTRACTOR VACUUM KIWI (MISCELLANEOUS) IMPLANT
GLOVE BIOGEL PI IND STRL 7.0 (GLOVE) ×3 IMPLANT
GLOVE BIOGEL PI INDICATOR 7.0 (GLOVE) ×6
GLOVE ECLIPSE 6.5 STRL STRAW (GLOVE) ×3 IMPLANT
GOWN STRL REUS W/TWL LRG LVL3 (GOWN DISPOSABLE) ×9 IMPLANT
HEMOSTAT ARISTA ABSORB 3G PWDR (HEMOSTASIS) ×2 IMPLANT
KIT ABG SYR 3ML LUER SLIP (SYRINGE) IMPLANT
NDL HYPO 25X5/8 SAFETYGLIDE (NEEDLE) IMPLANT
NEEDLE HYPO 25X5/8 SAFETYGLIDE (NEEDLE) IMPLANT
NS IRRIG 1000ML POUR BTL (IV SOLUTION) ×3 IMPLANT
PACK C SECTION WH (CUSTOM PROCEDURE TRAY) ×3 IMPLANT
PAD ABD 7.5X8 STRL (GAUZE/BANDAGES/DRESSINGS) ×3 IMPLANT
PAD OB MATERNITY 4.3X12.25 (PERSONAL CARE ITEMS) ×3 IMPLANT
PENCIL SMOKE EVAC W/HOLSTER (ELECTROSURGICAL) ×3 IMPLANT
RTRCTR C-SECT PINK 25CM LRG (MISCELLANEOUS) ×3 IMPLANT
STRIP CLOSURE SKIN 1/2X4 (GAUZE/BANDAGES/DRESSINGS) ×2 IMPLANT
STRIP CLOSURE SKIN 1X5 (GAUZE/BANDAGES/DRESSINGS) ×1 IMPLANT
STRIP CLOSURE STERI-STRIP 1X5 (GAUZE/BANDAGES/DRESSINGS) ×1
SUT CHROMIC 2 0 CT 1 (SUTURE) ×2 IMPLANT
SUT PLAIN 0 NONE (SUTURE) IMPLANT
SUT PLAIN 2 0 XLH (SUTURE) IMPLANT
SUT VIC AB 0 CT1 27 (SUTURE) ×4
SUT VIC AB 0 CT1 27XBRD ANBCTR (SUTURE) ×2 IMPLANT
SUT VIC AB 0 CTX 36 (SUTURE) ×8
SUT VIC AB 0 CTX36XBRD ANBCTRL (SUTURE) ×3 IMPLANT
SUT VIC AB 2-0 CT1 27 (SUTURE) ×6
SUT VIC AB 2-0 CT1 TAPERPNT 27 (SUTURE) ×1 IMPLANT
SUT VIC AB 4-0 KS 27 (SUTURE) ×5 IMPLANT
TOWEL OR 17X24 6PK STRL BLUE (TOWEL DISPOSABLE) ×3 IMPLANT
TRAY FOLEY W/BAG SLVR 14FR LF (SET/KITS/TRAYS/PACK) IMPLANT
WATER STERILE IRR 1000ML POUR (IV SOLUTION) ×3 IMPLANT

## 2018-11-22 NOTE — Op Note (Signed)
PreOp Diagnosis: 1) intrauterine pregnancy @ [redacted]w[redacted]d 2) prior C-section x 2 PostOp Diagnosis: same Procedure: Repeat C-section Surgeon: Dr. Janyth Pupa Assistant: Dr. Christophe Louis Anesthesia: spinal Complications: none EBL: 754cc UOP: 200cc Fluids: 3100cc  Findings: Female infant from vertex presentation, normal uterus, tubes and ovaries bilaterally.  PROCEDURE:  Informed consent was obtained from the patient with risks, benefits, complications, treatment options, and expected outcomes discussed with the patient.  The patient concurred with the proposed plan, giving informed consent with form signed.   The patient was taken to Operating Room, and identified with the procedure verified as C-Section Delivery with Time Out. With induction of anesthesia, the patient was prepped and draped in the usual sterile fashion. A Pfannenstiel incision was made and carried down through the subcutaneous tissue to the fascia. The fascia was incised in the midline and extended transversely. The inferior aspect of the fascial incision was grasped with Kochers elevated and the underlying muscle dissected off. The superior aspect of the facial incision was in similar fashion, grasped elevated and rectus muscles dissected off using the scalpel due to adhesions. A small window was already noted into the peritoneum.  Peritoneal incision was extended longitudinally. The utero-vesical peritoneal reflection was identified though the segment was already fairly thing.  The peritoneal reflection was incised transversely with the [facility] scissors, the incision extended laterally, the bladder flap created digitally. A low transverse uterine incision was made and the infants head delivered atraumatically. After the umbilical cord was clamped and cut cord blood was obtained for evaluation.   The placenta was removed intact and appeared normal. The uterine outline, tubes and ovaries appeared normal. The uterine incision was closed with  running locked sutures of 0 Vicryl and a second layer of the same stitch was used in an imbricating fashion.  Interrupted stitches of 0-vicryl were used to obtain hemostasis.  The pericolic gutters were then cleared of all clots and debris. Excellent hemostasis was noted.  Arista and interceed was placed.  The peritoneum was closed in a running fashion.  Bleeding was noted from the rectus muscle and an interrupted figure of eight of 2-0 vicryl was used to obtain hemostasis. The fascia was then reapproximated with running sutures of 0 Vicryl. The subcutaneous tissue was reapproximated with 2-0 plain gut suture.  The skin was closed with 4-0 vicryl in a subcuticular fashion.  Instrument, sponge, and needle counts were correct prior the abdominal closure and at the conclusion of the case. The patient was taken to recovery in stable condition.  Janyth Pupa, DO [phone] (cell) [phone] (office)

## 2018-11-22 NOTE — Lactation Note (Signed)
This note was copied from a Lisa Sanchez's chart. Lactation Consultation Note  Patient Name: [name] Sanchez JOACZ'Y Date: 11/22/2018 Reason for consult: Initial assessment;Term  2109 -2125 - I conducted an initial consult with Lisa Sanchez Sanchez, a P3 who breast fed her previous two children for 3-4 years each. Lisa Sanchez "Lisa Sanchez" was latched in cradle hold on [name]'s left breast upon entry. I noted rhythmic suckling sequences and breast tissue movement.  Lisa Sanchez would occasionally release the breast and then self-latch. [name] has everted nipples and breast tissue WNL.  I assisted with teaching how to hand express. I did not note any colostrum on the right breast when we practiced.   [name] is a [employer] Employee with the [employer]. I indicated that I would check to see if she were eligible for an employee pump. She is interested in pumping for return to work in 8 weeks.  I conducted basic breast feeding education including day 1 feeding patterns, day 1 output expectations and briefly previous contents of the [facility] booklet.  PLAN: Breast feed on demand 8-12 times. Wake Lisa Sanchez to feed as needed and monitor hunger cues. Hand express for stimulation. Feed any EBM back to Lisa Sanchez. Call lactation as needed for support.  Maternal Data Formula Feeding for Exclusion: No Has patient been taught Hand Expression?: Yes Does the patient have breastfeeding experience prior to this delivery?: Yes  Feeding Feeding Type: Breast Fed  LATCH Score Latch: Repeated attempts needed to sustain latch, nipple held in mouth throughout feeding, stimulation needed to elicit sucking reflex.  Audible Swallowing: None  Type of Nipple: Everted at rest and after stimulation  Comfort (Breast/Nipple): Soft / non-tender  Hold (Positioning): No assistance needed to correctly position infant at breast.  LATCH Score: 7  Interventions Interventions: Breast feeding basics reviewed;Skin to skin;Hand express  Lactation Tools  Discussed/Used     Consult Status Consult Status: Follow-up Date: 11/23/18 Follow-up type: In-patient    Lenore Manner 11/22/2018, 9:35 PM

## 2018-11-22 NOTE — Anesthesia Preprocedure Evaluation (Signed)
Anesthesia Evaluation  Patient identified by MRN, date of birth, ID band Patient awake    Reviewed: Allergy & Precautions, H&P , NPO status , Patient's Chart, lab work & pertinent test results  History of Anesthesia Complications Negative for: history of anesthetic complications  Airway Mallampati: II  TM Distance: >3 FB Neck ROM: full    Dental no notable dental hx.    Pulmonary neg pulmonary ROS,    Pulmonary exam normal        Cardiovascular negative cardio ROS Normal cardiovascular exam     Neuro/Psych negative neurological ROS  negative psych ROS   GI/Hepatic negative GI ROS, Neg liver ROS,   Endo/Other  negative endocrine ROS  Renal/GU negative Renal ROS  negative genitourinary   Musculoskeletal   Abdominal   Peds  Hematology negative hematology ROS (+)   Anesthesia Other Findings Prior section x2  Reproductive/Obstetrics (+) Pregnancy                             Anesthesia Physical Anesthesia Plan  ASA: II  Anesthesia Plan: Spinal   Post-op Pain Management:    Induction:   PONV Risk Score and Plan: Ondansetron and Treatment may vary due to age or medical condition  Airway Management Planned:   Additional Equipment:   Intra-op Plan:   Post-operative Plan:   Informed Consent: I have reviewed the patients History and Physical, chart, labs and discussed the procedure including the risks, benefits and alternatives for the proposed anesthesia with the patient or authorized representative who has indicated his/her understanding and acceptance.       Plan Discussed with:   Anesthesia Plan Comments:         Anesthesia Quick Evaluation

## 2018-11-22 NOTE — Anesthesia Procedure Notes (Signed)
Spinal  Patient location during procedure: OR Staffing Anesthesiologist: Davanee Klinkner E, MD Performed: anesthesiologist  Preanesthetic Checklist Completed: patient identified, surgical consent, pre-op evaluation, timeout performed, IV checked, risks and benefits discussed and monitors and equipment checked Spinal Block Patient position: sitting Prep: site prepped and draped and DuraPrep Patient monitoring: continuous pulse ox, blood pressure and heart rate Approach: midline Location: L3-4 Injection technique: single-shot Needle Needle type: Pencan  Needle gauge: 24 G Needle length: 9 cm Additional Notes Functioning IV was confirmed and monitors were applied. Sterile prep and drape, including hand hygiene and sterile gloves were used. The patient was positioned and the spine was prepped. The skin was anesthetized with lidocaine.  Free flow of clear CSF was obtained prior to injecting local anesthetic into the CSF. The needle was carefully withdrawn. The patient tolerated the procedure well.      

## 2018-11-22 NOTE — Interval H&P Note (Signed)
History and Physical Interval Note:  11/22/2018 11:40 AM  Lisa Sanchez  has presented today for surgery, with the diagnosis of Z98.891 History of cesarean delivery.  The various methods of treatment have been discussed with the patient and family. After consideration of risks, benefits and other options for treatment, the patient has consented to  Procedure(s): CESAREAN SECTION (N/A) as a surgical intervention.  The patient's history has been reviewed, patient examined, no change in status, stable for surgery.  I have reviewed the patient's chart and labs.  Questions were answered to the patient's satisfaction.     Annalee Genta

## 2018-11-22 NOTE — Anesthesia Postprocedure Evaluation (Signed)
Anesthesia Post Note  Patient: Lisa Sanchez  Procedure(s) Performed: CESAREAN SECTION (N/A )     Patient location during evaluation: PACU Anesthesia Type: Spinal Level of consciousness: oriented and awake and alert Pain management: pain level controlled Vital Signs Assessment: post-procedure vital signs reviewed and stable Respiratory status: spontaneous breathing, respiratory function stable and nonlabored ventilation Cardiovascular status: blood pressure returned to baseline and stable Postop Assessment: no headache, no backache, no apparent nausea or vomiting and spinal receding Anesthetic complications: no    Last Vitals:  Vitals:   11/22/18 1435 11/22/18 1441  BP:  101/68  Pulse: 70 72  Resp: 14 18  Temp:  36.5 C  SpO2: 96% 100%    Last Pain:  Vitals:   11/22/18 1441  TempSrc: Oral  PainSc: 3    Pain Goal:                Epidural/Spinal Function Cutaneous sensation: Tingles (11/22/18 1441), Patient able to flex knees: Yes (11/22/18 1441), Patient able to lift hips off bed: Yes (11/22/18 1441), Back pain beyond tenderness at insertion site: No (11/22/18 1441), Progressively worsening motor and/or sensory loss: No (11/22/18 1441), Bowel and/or bladder incontinence post epidural: No (11/22/18 1441)  Lidia Collum

## 2018-11-22 NOTE — Transfer of Care (Signed)
Immediate Anesthesia Transfer of Care Note  Patient: Lisa Sanchez  Procedure(s) Performed: CESAREAN SECTION (N/A )  Patient Location: PACU  Anesthesia Type:Spinal  Level of Consciousness: awake, alert  and patient cooperative  Airway & Oxygen Therapy: Patient Spontanous Breathing  Post-op Assessment: Report given to RN and Post -op Vital signs reviewed and stable  Post vital signs: Reviewed and stable  Last Vitals:  Vitals Value Taken Time  BP 102/70 11/22/18 1335  Temp    Pulse 80 11/22/18 1338  Resp 16 11/22/18 1338  SpO2 97 % 11/22/18 1338  Vitals shown include unvalidated device data.  Last Pain:  Vitals:   11/22/18 1059  TempSrc: Oral         Complications: No apparent anesthesia complications

## 2018-11-23 LAB — CBC
HCT: 25.7 % — ABNORMAL LOW (ref 36.0–46.0)
Hemoglobin: 8.9 g/dL — ABNORMAL LOW (ref 12.0–15.0)
MCH: 30.4 pg (ref 26.0–34.0)
MCHC: 34.6 g/dL (ref 30.0–36.0)
MCV: 87.7 fL (ref 80.0–100.0)
Platelets: 170 10*3/uL (ref 150–400)
RBC: 2.93 MIL/uL — ABNORMAL LOW (ref 3.87–5.11)
RDW: 12.9 % (ref 11.5–15.5)
WBC: 11.9 10*3/uL — ABNORMAL HIGH (ref 4.0–10.5)
nRBC: 0 % (ref 0.0–0.2)

## 2018-11-23 MED ORDER — LACTATED RINGERS IV BOLUS
500.0000 mL | Freq: Once | INTRAVENOUS | Status: AC
  Administered 2018-11-23: 500 mL via INTRAVENOUS

## 2018-11-23 MED ORDER — FERROUS GLUCONATE 324 (38 FE) MG PO TABS
324.0000 mg | ORAL_TABLET | Freq: Every day | ORAL | Status: DC
  Administered 2018-11-23 – 2018-11-25 (×3): 324 mg via ORAL
  Filled 2018-11-23 (×4): qty 1

## 2018-11-23 NOTE — Progress Notes (Signed)
Postop Note Day # 1  S:  Patient resting comfortable in bed.  Pain controlled.  Tolerating general diet. No flatus, no BM.  Lochia minimal.  Some ambulation without difficulty.  She denies n/v/f/c, SOB, or CP.  Pt plans on breastfeeding.  O: Temp:  [97.7 F (36.5 C)-98.9 F (37.2 C)] 98.1 F (36.7 C) (10/21 0414) Pulse Rate:  [60-89] 75 (10/21 0414) Resp:  [11-22] 18 (10/21 0414) BP: (94-118)/(60-85) 108/64 (10/21 0414) SpO2:  [94 %-100 %] 97 % (10/20 2356) Weight:  [88.9 kg] 88.9 kg (10/20 1059)  UOP: 300cc/8hr  Gen: A&Ox3, NAD CV: RRR, no MRG Resp: CTAB Abdomen: soft, NT, ND +BS Uterus: firm, non-tender, below umbilicus Incision: c/d/i, bandage on Ext: No edema, no calf tenderness bilaterally, SCDs in place  Labs:  Recent Labs    11/20/18 0839 11/23/18 0548  HGB 11.2* 8.9*    A/P: Pt is a 28 y.o. [id number] s/p repeat C_section, POD#1  - Pain well controlled -GU: UOP low normal, 500cc bolus this am, encouraged po hydration -GI: Tolerating general diet -Activity: encouraged sitting up to chair and ambulation as tolerated -Prophylaxis: SCDs while in bed, early ambulation -Labs: appropriate decline following recent surgery, iron daily Continue with routine postop care as outlined above  Janyth Pupa, DO [phone] (cell) [phone] (office)

## 2018-11-24 NOTE — Progress Notes (Signed)
CSW received consult due to score 9 on Edinburgh Depression Screen.    CSW went to speak with MOB at bedside. Upon entering the room CSW observed that MOB was sitting on side of bed pumping while FOB stood close by. CSW congratulated Mob and FOB on the birth of infant [name]! CSW advised MOB of the HIPPA policy and MOB was agreeable to FOB leaving the room. FOB left room with no issue. CSW advised MOB of CSW's role and the reason for CSW coming to visit with her. MOB reported that she didn't understand really what question 10 was asking. MOB reports that she has never felt like harming herself or infant. MOB expressed that she has two other children at home, with no concerns. MOB expressed that she has no mental health history such as PPD, depression, or anxiety. MOB reports that she has been feeling fairly well and denies SI or HI at this time. CSW questions MOB on DV and MOB reports that she is not in a domestic violence relationship either. MOB advised CSW that she has support from her mom, dad, and spouse. MOB reports that she has all needed items to care for infant and desires for infant to get follow up care at [facility] once home.   CSW provided education regarding Baby Blues vs PMADs and provided MOB with resources for mental health follow up.  CSW encouraged MOB to evaluate her mental health throughout the postpartum period with the use of the New Mom Checklist developed by Postpartum [employer] as well as the [place] Postnatal Depression Scale and notify a medical professional if symptoms arise.        Virgie Dad Jacayla Nordell, MSW, LCSW [facility] [place] [facility] [place] [phone]

## 2018-11-24 NOTE — Lactation Note (Signed)
This note was copied from a baby's chart. Lactation Consultation Note  Patient Name: [name] [id number] Date: 11/24/2018 Reason for consult: Follow-up assessment;Term;Infant weight loss(8% weight loss)  1537 - 1609 - I followed up with Ms. Apel and her daughter [name]. Her baby is at 8% weight loss. However, her milk appears to be transitioning.  I observed Ms. Phung latch baby in cradle hold on her right breast. Baby struggled to latch initially but then was able to grasp the breast with rhythmic and sustained suckling sequences.  I noted that Ms. Brookens's areolar tissue is edematous today, which is flattening her nipple. I showed her how to do reverse pressure softening, which appeared to help with the swelling. I also provided her with ice packs, and encourage her to lie back and lift the breast up to allow drainage.  Ms. Kendrick has a DEBP at the bedside. She is very sensitive at this time around her nipples, but I did not note any trauma. I recommended that she do some pre-pumping and post-pumping today to help with softening the tissue around her areola. I provided her with a foley cup to feed any EBM to baby.  Weight loss was discussed. Ms. Maish feels that baby's weight will rebound now that her milk is transitioning. I encouraged feeding 8-12 times a day and to supplement with any pumped breast milk.  She verbalized understanding.    Maternal Data Formula Feeding for Exclusion: No Has patient been taught Hand Expression?: Yes Does the patient have breastfeeding experience prior to this delivery?: Yes  Feeding Feeding Type: Breast Fed  LATCH Score Latch: Grasps breast easily, tongue down, lips flanged, rhythmical sucking.  Audible Swallowing: A few with stimulation  Type of Nipple: Everted at rest and after stimulation  Comfort (Breast/Nipple): Soft / non-tender  Hold (Positioning): No assistance needed to correctly position infant at breast.  LATCH Score:  9  Interventions Interventions: Breast feeding basics reviewed;Skin to skin;Breast massage;Hand express;Pre-pump if needed;Reverse pressure;Shells;Hand pump;Ice  Lactation Tools Discussed/Used Tools: Pump;Shells;Flanges;Feeding cup Flange Size: 30 Shell Type: Inverted Breast pump type: Double-Electric Breast Pump;Manual Pump Review: Setup, frequency, and cleaning   Consult Status Consult Status: Follow-up Date: 11/25/18 Follow-up type: In-patient    Lenore Manner 11/24/2018, 4:57 PM

## 2018-11-24 NOTE — Progress Notes (Signed)
Postop Note Day # 2  S:  Patient resting comfortable in bed.  Pain controlled.  Tolerating general diet. + flatus, no BM.  Lochia minimal.  Some ambulation and voiding freely.  She denies n/v/f/c, SOB, or CP.  Pt plans on breastfeeding.  O: Temp:  [97.7 F (36.5 C)-98.2 F (36.8 C)] 97.9 F (36.6 C) (10/22 0543) Pulse Rate:  [68-78] 69 (10/22 0543) Resp:  [16-20] 16 (10/22 0543) BP: (88-100)/(54-69) 98/69 (10/22 0543) SpO2:  [96 %-100 %] 100 % (10/21 2150)  UOP: 300cc/8hr  Gen: A&Ox3, NAD CV: RRR, no MRG Resp: CTAB Abdomen: soft, NT, ND +BS Uterus: firm, non-tender, below umbilicus Incision: c/d/i, bandage on Ext: minimal edema, no calf tenderness bilaterally, SCDs in place  Labs:  CBC Latest Ref Rng & Units 11/23/2018 11/20/2018 11/27/2014  WBC 4.0 - 10.5 K/uL 11.9(H) 8.7 14.1(H)  Hemoglobin 12.0 - 15.0 g/dL 8.9(L) 11.2(L) 7.2(L)  Hematocrit 36.0 - 46.0 % 25.7(L) 32.8(L) 22.7(L)  Platelets 150 - 400 K/uL 170 163 189    A/P: Pt is a 28 y.o. [id number] s/p repeat C_section, POD#2  - Pain well controlled -GU: improved output, now voiding freely -GI: Tolerating general diet -Activity: encouraged sitting up to chair and ambulation as tolerated -Prophylaxis: SCDs while in bed, early ambulation -Labs: appropriate decline following recent surgery, iron daily Continue with routine postop care as outlined above  Janyth Pupa, DO [phone] (cell) [phone] (office)

## 2018-11-25 MED ORDER — DOCUSATE SODIUM 100 MG PO CAPS: 100.0000 mg | ORAL_CAPSULE | Freq: Two times a day (BID) | ORAL | 0 refills | Status: DC

## 2018-11-25 MED ORDER — OXYCODONE HCL 5 MG PO TABS: 5.0000 mg | ORAL_TABLET | Freq: Four times a day (QID) | ORAL | 0 refills | Status: AC | PRN

## 2018-11-25 MED ORDER — ACETAMINOPHEN 500 MG PO TABS: 500.0000 mg | ORAL_TABLET | Freq: Four times a day (QID) | ORAL | 0 refills | Status: DC | PRN

## 2018-11-25 MED ORDER — IBUPROFEN 600 MG PO TABS: 600.0000 mg | ORAL_TABLET | Freq: Four times a day (QID) | ORAL | 0 refills | Status: DC

## 2018-11-25 NOTE — Discharge Instructions (Addendum)
Cesarean Delivery, Care After This sheet gives you information about how to care for yourself after your procedure. Your health care provider may also give you more specific instructions. If you have problems or questions, contact your health care provider. What can I expect after the procedure? After the procedure, it is common to have:  A small amount of blood or clear fluid coming from the incision.  Some redness, swelling, and pain in your incision area.  Some abdominal pain and soreness.  Vaginal bleeding (lochia). Even though you did not have a vaginal delivery, you will still have vaginal bleeding and discharge.  Pelvic cramps.  Fatigue. You may have pain, swelling, and discomfort in the tissue between your vagina and your anus (perineum) if:  Your C-section was unplanned, and you were allowed to labor and push.  An incision was made in the area (episiotomy) or the tissue tore during attempted vaginal delivery. Follow these instructions at home: Incision care   Follow instructions from your health care provider about how to take care of your incision. Make sure you: ? Wash your hands with soap and water before you change your bandage (dressing). If soap and water are not available, use hand sanitizer. ? If you have a dressing, change it or remove it as told by your health care provider. ? Leave stitches (sutures), skin staples, skin glue, or adhesive strips in place. These skin closures may need to stay in place for 2 weeks or longer. If adhesive strip edges start to loosen and curl up, you may trim the loose edges. Do not remove adhesive strips completely unless your health care provider tells you to do that.  Check your incision area every day for signs of infection. Check for: ? More redness, swelling, or pain. ? More fluid or blood. ? Warmth. ? Pus or a bad smell.  Do not take baths, swim, or use a hot tub until your health care provider says it's okay. Ask your health  care provider if you can take showers.  When you cough or sneeze, hug a pillow. This helps with pain and decreases the chance of your incision opening up (dehiscing). Do this until your incision heals. Medicines  Take over-the-counter and prescription medicines only as told by your health care provider.  For pain you may alternate between Ibuprofen and Tylenol.  For severe or breakthrough pain, you may take the oxycodone along with the tylenol if needed.  Be sure to continue stool softener daily or twice daily while taking this medication as it may cause constipation.  If you were prescribed an antibiotic medicine, take it as told by your health care provider. Do not stop taking the antibiotic even if you start to feel better.  Do not drive or use heavy machinery while taking prescription pain medicine. Lifestyle  Do not drink alcohol. This is especially important if you are breastfeeding or taking pain medicine.  Do not use any products that contain nicotine or tobacco, such as cigarettes, e-cigarettes, and chewing tobacco. If you need help quitting, ask your health care provider. Eating and drinking  Drink at least 8 eight-ounce glasses of water every day unless told not to by your health care provider. If you breastfeed, you may need to drink even more water.  Eat high-fiber foods every day. These foods may help prevent or relieve constipation. High-fiber foods include: ? Whole grain cereals and breads. ? Brown rice. ? Beans. ? Fresh fruits and vegetables. Activity   If possible,  have someone help you care for your baby and help with household activities for at least a few days after you leave the hospital.  Return to your normal activities as told by your health care provider. Ask your health care provider what activities are safe for you.  Rest as much as possible. Try to rest or take a nap while your baby is sleeping.  Do not lift anything that is heavier than 10 lbs (4.5 kg),  or the limit that you were told, until your health care provider says that it is safe.  Talk with your health care provider about when you can engage in sexual activity. This may depend on your: ? Risk of infection. ? How fast you heal. ? Comfort and desire to engage in sexual activity. General instructions  Do not use tampons or douches until your health care provider approves.  Wear loose, comfortable clothing and a supportive and well-fitting bra.  Keep your perineum clean and dry. Wipe from front to back when you use the toilet.  If you pass a blood clot, save it and call your health care provider to discuss. Do not flush blood clots down the toilet before you get instructions from your health care provider.  Keep all follow-up visits for you and your baby as told by your health care provider. This is important. Contact a health care provider if:  You have: ? A fever. ? Bad-smelling vaginal discharge. ? Pus or a bad smell coming from your incision. ? Difficulty or pain when urinating. ? A sudden increase or decrease in the frequency of your bowel movements. ? More redness, swelling, or pain around your incision. ? More fluid or blood coming from your incision. ? A rash. ? Nausea. ? Little or no interest in activities you used to enjoy. ? Questions about caring for yourself or your baby.  Your incision feels warm to the touch.  Your breasts turn red or become painful or hard.  You feel unusually sad or worried.  You vomit.  You pass a blood clot from your vagina.  You urinate more than usual.  You are dizzy or light-headed. Get help right away if:  You have: ? Pain that does not go away or get better with medicine. ? Chest pain. ? Difficulty breathing. ? Blurred vision or spots in your vision. ? Thoughts about hurting yourself or your baby. ? New pain in your abdomen or in one of your legs. ? A severe headache.  You faint.  You bleed from your vagina so  much that you fill more than one sanitary pad in one hour. Bleeding should not be heavier than your heaviest period. Summary  After the procedure, it is common to have pain at your incision site, abdominal cramping, and slight bleeding from your vagina.  Check your incision area every day for signs of infection.  Tell your health care provider about any unusual symptoms.  Keep all follow-up visits for you and your baby as told by your health care provider. This information is not intended to replace advice given to you by your health care provider. Make sure you discuss any questions you have with your health care provider. Document Released: 10/11/2001 Document Revised: 07/28/2017 Document Reviewed: 07/28/2017 Elsevier Patient Education  2020 [employer].

## 2018-11-25 NOTE — Lactation Note (Signed)
This note was copied from a baby's chart. Lactation Consultation Note  Patient Name: Girl Meyli Boice KGMWN'U Date: 11/25/2018 Reason for consult: Follow-up assessment  P3 mother whose infant is now 15 hours old.  Mother breast fed her other two children for 3-4 years each.  Mother had a couple of breast feeding questions prior to discharge.  She feels like her breasts are fuller today.  Engorgement prevention/treatment discussed.  Mother has a manual and a personal pump for home use.  She has our OP phone number for questions after discharge.  Father present.  Family packed and waiting for assistance out of hospital.     Maternal Data    Feeding    LATCH Score                   Interventions    Lactation Tools Discussed/Used     Consult Status Consult Status: Complete Date: 11/25/18 Follow-up type: Call as needed    Larkyn Greenberger R Jafeth Mustin 11/25/2018, 11:23 AM

## 2018-11-25 NOTE — Discharge Summary (Signed)
OB Discharge Summary     Patient Name: Lisa Sanchez DOB: 1990-04-29 MRN: [id number]  Date of admission: 11/22/2018 Delivering MD: Myna Hidalgo   Date of discharge: 11/25/2018  Admitting diagnosis: Z98.891 History of cesarean delivery Intrauterine pregnancy: [redacted]w[redacted]d     Secondary diagnosis:  Active Problems:   Normal intrauterine pregnancy in third trimester  Additional problems: Prior C-section x 2     Discharge diagnosis: Term Pregnancy Delivered                                                                                                Post partum procedures:none  Augmentation: n/a  Complications: None  Hospital course:  Sceduled C/S   28 y.o. yo G3P3003 at [redacted]w[redacted]d was admitted to the hospital 11/22/2018 for scheduled cesarean section with the following indication:prior C-section x 2.  Membrane Rupture Time/Date: 12:37 PM ,11/22/2018   Patient delivered a Viable infant.11/22/2018  Details of operation can be found in separate operative note.  [name] had an uncomplicated postpartum course.  She is ambulating, tolerating a regular diet, passing flatus, and urinating well. Patient is discharged home in stable condition on  11/25/18         Physical exam  Vitals:   11/24/18 0543 11/24/18 1352 11/24/18 2217 11/25/18 0551  BP: 98/69 106/71 110/66 109/66  Pulse: 69 82 83 86  Resp: 16 16 16 16   Temp: 97.9 F (36.6 C) 98.3 F (36.8 C) 98 F (36.7 C) 98 F (36.7 C)  TempSrc: Oral Oral Oral Oral  SpO2:   100% 98%  Weight:      Height:       General: alert, cooperative and no distress CV: RRR Lungs: CTAB Abd: soft and non-tender, no rebound, no guarding Uterine Fundus: firm Incision: Dressing is clean, dry, and intact DVT Evaluation: No evidence of DVT seen on physical exam. Labs: Lab Results  Component Value Date   WBC 11.9 (H) 11/23/2018   HGB 8.9 (L) 11/23/2018   HCT 25.7 (L) 11/23/2018   MCV 87.7 11/23/2018   PLT 170 11/23/2018   No flowsheet data  found.  Discharge instruction: per After Visit Summary and "Baby and Me Booklet".  After visit meds:  Allergies as of 11/25/2018   No Known Allergies     Medication List    TAKE these medications   acetaminophen 500 MG tablet Commonly known as: TYLENOL Take 1 tablet (500 mg total) by mouth every 6 (six) hours as needed for mild pain or moderate pain.   docusate sodium 100 MG capsule Commonly known as: Colace Take 1 capsule (100 mg total) by mouth 2 (two) times daily.   ferrous sulfate 325 (65 FE) MG tablet Take 325 mg by mouth daily with breakfast.   ibuprofen 600 MG tablet Commonly known as: ADVIL Take 1 tablet (600 mg total) by mouth every 6 (six) hours.   oxyCODONE 5 MG immediate release tablet Commonly known as: Oxy IR/ROXICODONE Take 1 tablet (5 mg total) by mouth every 6 (six) hours as needed for up to 7 days for severe pain or breakthrough pain.  prenatal multivitamin Tabs tablet Take 1 tablet by mouth daily.       Diet: routine diet  Activity: Advance as tolerated. Pelvic rest for 6 weeks.   Outpatient follow up:2 weeks Follow up Appt:No future appointments. Follow up Visit:No follow-ups on file.  Postpartum contraception: Not Discussed  Newborn Data: Live born female  Birth Weight: 7 lb 8.8 oz (3425 g) APGAR: 84, 9  Newborn Delivery   Birth date/time: 11/22/2018 12:37:00 Delivery type: C-Section, Low Transverse Trial of labor: No C-section categorization: Repeat      Baby Feeding: Breast Disposition:home with mother   11/25/2018 Annalee Genta, DO

## 2019-07-14 ENCOUNTER — Other Ambulatory Visit: Payer: Self-pay

## 2019-07-14 ENCOUNTER — Encounter: Payer: Self-pay | Admitting: Plastic Surgery

## 2019-07-14 ENCOUNTER — Ambulatory Visit (INDEPENDENT_AMBULATORY_CARE_PROVIDER_SITE_OTHER): Payer: No Typology Code available for payment source | Admitting: Plastic Surgery

## 2019-07-14 DIAGNOSIS — N63 Unspecified lump in unspecified breast: Secondary | ICD-10-CM | POA: Diagnosis not present

## 2019-07-14 DIAGNOSIS — N6489 Other specified disorders of breast: Secondary | ICD-10-CM | POA: Diagnosis not present
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
## 2019-11-01 ENCOUNTER — Other Ambulatory Visit: Payer: Self-pay | Admitting: Family Medicine

## 2019-11-01 DIAGNOSIS — R2231 Localized swelling, mass and lump, right upper limb: Secondary | ICD-10-CM

## 2019-11-21 ENCOUNTER — Ambulatory Visit
Admission: RE | Admit: 2019-11-21 | Discharge: 2019-11-21 | Disposition: A | Discharge status: Home / Self Care | Payer: No Typology Code available for payment source | Source: Ambulatory Visit | Attending: Family Medicine | Admitting: Family Medicine

## 2019-11-21 ENCOUNTER — Other Ambulatory Visit: Payer: Self-pay

## 2019-11-21 DIAGNOSIS — R2231 Localized swelling, mass and lump, right upper limb: Secondary | ICD-10-CM

## 2020-02-05 ENCOUNTER — Other Ambulatory Visit: Payer: Self-pay

## 2020-02-05 ENCOUNTER — Ambulatory Visit (HOSPITAL_COMMUNITY)
Admission: EM | Admit: 2020-02-05 | Discharge: 2020-02-05 | Disposition: A | Discharge status: Home / Self Care | Payer: No Typology Code available for payment source | Attending: Physician Assistant | Admitting: Physician Assistant

## 2020-02-05 DIAGNOSIS — J069 Acute upper respiratory infection, unspecified: Secondary | ICD-10-CM | POA: Insufficient documentation

## 2020-02-05 DIAGNOSIS — U071 COVID-19: Secondary | ICD-10-CM | POA: Diagnosis not present

## 2020-02-05 NOTE — ED Triage Notes (Signed)
Pt reports URI sx's that started today with sneezing and voice is horse.

## 2020-02-05 NOTE — Discharge Instructions (Addendum)
COVID test pending.  If positive self isolate 10 days from symptom onset Recommend daily Zyrtec or Claritin.
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
## 2020-02-06 LAB — SARS CORONAVIRUS 2 (TAT 6-24 HRS): SARS Coronavirus 2: POSITIVE — AB

## 2020-12-11 ENCOUNTER — Ambulatory Visit: Payer: Self-pay | Admitting: General Surgery

## 2020-12-20 ENCOUNTER — Other Ambulatory Visit: Payer: Self-pay | Admitting: General Surgery

## 2020-12-20 DIAGNOSIS — N6310 Unspecified lump in the right breast, unspecified quadrant: Secondary | ICD-10-CM

## 2021-02-05 ENCOUNTER — Other Ambulatory Visit: Payer: No Typology Code available for payment source

## 2021-03-07 ENCOUNTER — Other Ambulatory Visit: Payer: Self-pay

## 2021-03-07 ENCOUNTER — Ambulatory Visit
Admission: RE | Admit: 2021-03-07 | Discharge: 2021-03-07 | Disposition: A | Discharge status: Home / Self Care | Payer: No Typology Code available for payment source | Source: Ambulatory Visit | Attending: General Surgery | Admitting: General Surgery

## 2021-03-07 ENCOUNTER — Ambulatory Visit
Admission: RE | Admit: 2021-03-07 | Discharge: 2021-03-07 | Disposition: A | Discharge status: Home / Self Care | Payer: Medicaid Other | Source: Ambulatory Visit | Attending: General Surgery | Admitting: General Surgery

## 2021-03-07 ENCOUNTER — Other Ambulatory Visit: Payer: Self-pay | Admitting: General Surgery

## 2021-03-07 DIAGNOSIS — R2231 Localized swelling, mass and lump, right upper limb: Secondary | ICD-10-CM

## 2021-03-07 DIAGNOSIS — N6310 Unspecified lump in the right breast, unspecified quadrant: Secondary | ICD-10-CM

## 2021-03-14 ENCOUNTER — Other Ambulatory Visit: Payer: No Typology Code available for payment source

## 2021-04-07 ENCOUNTER — Encounter: Payer: Self-pay | Admitting: Emergency Medicine

## 2021-04-07 ENCOUNTER — Ambulatory Visit
Admission: EM | Admit: 2021-04-07 | Discharge: 2021-04-07 | Disposition: A | Discharge status: Home / Self Care | Payer: Medicaid Other | Attending: Internal Medicine | Admitting: Internal Medicine

## 2021-04-07 ENCOUNTER — Other Ambulatory Visit: Payer: Self-pay

## 2021-04-07 DIAGNOSIS — J020 Streptococcal pharyngitis: Secondary | ICD-10-CM

## 2021-04-07 DIAGNOSIS — J0300 Acute streptococcal tonsillitis, unspecified: Secondary | ICD-10-CM

## 2021-04-07 LAB — POCT RAPID STREP A (OFFICE): Rapid Strep A Screen: POSITIVE — AB

## 2021-04-07 MED ORDER — PREDNISONE 20 MG PO TABS: 40.0000 mg | ORAL_TABLET | Freq: Every day | ORAL | 0 refills | Status: AC

## 2021-04-07 MED ORDER — AMOXICILLIN 500 MG PO CAPS: 500.0000 mg | ORAL_CAPSULE | Freq: Two times a day (BID) | ORAL | 0 refills | Status: AC
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
## 2021-04-07 NOTE — ED Triage Notes (Signed)
Significant other dx with strep last week. 2 days ago pt began having sore throat and hoarseness. ?

## 2021-04-07 NOTE — Discharge Instructions (Signed)
You have strep throat which is being treated with an antibiotic.  A prednisone steroid has also been prescribed due to the size of your tonsils.  Please follow-up if symptoms persist or worsen. ?

## 2021-06-05 ENCOUNTER — Ambulatory Visit
Admission: RE | Admit: 2021-06-05 | Discharge: 2021-06-05 | Disposition: A | Discharge status: Home / Self Care | Payer: Medicaid Other | Source: Ambulatory Visit | Attending: General Surgery | Admitting: General Surgery

## 2021-06-05 ENCOUNTER — Other Ambulatory Visit: Payer: Self-pay | Admitting: General Surgery

## 2021-06-05 DIAGNOSIS — R2231 Localized swelling, mass and lump, right upper limb: Secondary | ICD-10-CM

## 2021-06-05 DIAGNOSIS — N6310 Unspecified lump in the right breast, unspecified quadrant: Secondary | ICD-10-CM

## 2021-12-08 ENCOUNTER — Other Ambulatory Visit: Payer: Medicaid Other

## 2022-03-25 IMAGING — MG DIGITAL DIAGNOSTIC BILAT W/ TOMO W/ CAD
6 of 10 series · 6 of 30 positions shown · non-contrast
Comparison: None.

CLINICAL DATA: Patient presents for palpable mass underlying the
right axilla.

EXAM:
DIGITAL DIAGNOSTIC BILATERAL MAMMOGRAM WITH TOMOSYNTHESIS AND CAD;
US AXILLARY RIGHT
TECHNIQUE: Bilateral digital diagnostic mammography and breast tomosynthesis
was performed. The images were evaluated with computer-aided
detection.; Targeted ultrasound examination of the right axilla was
performed.

[R TAN synth-2D]
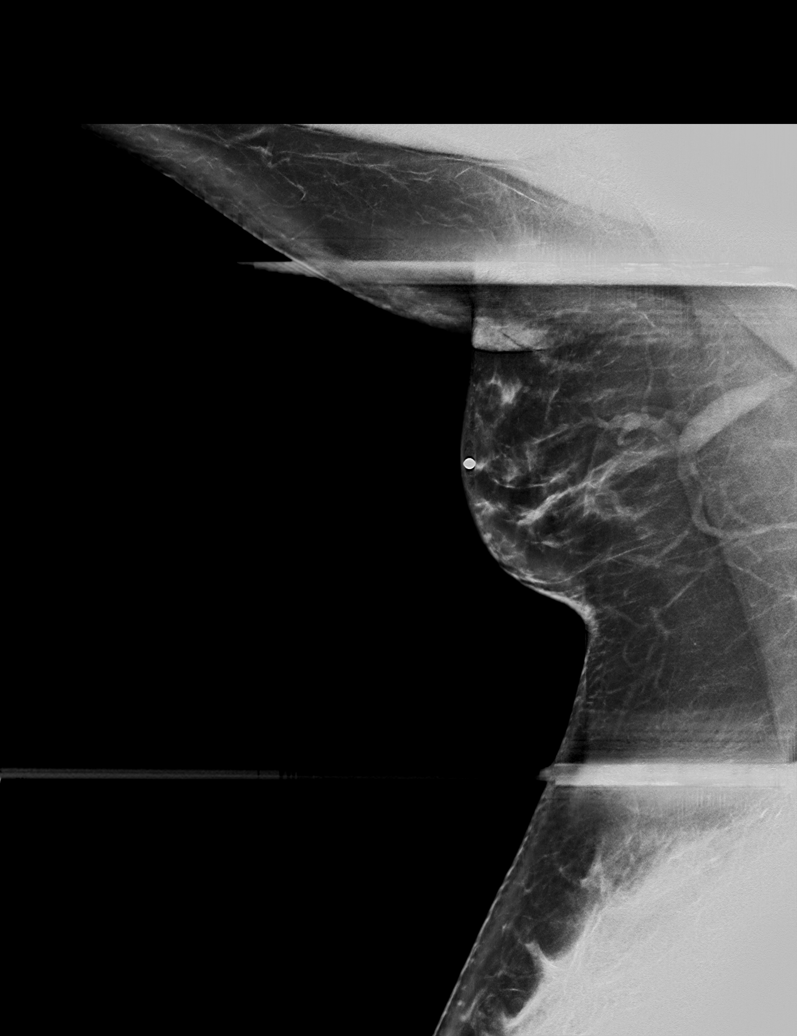

[R MLO synth-2D]
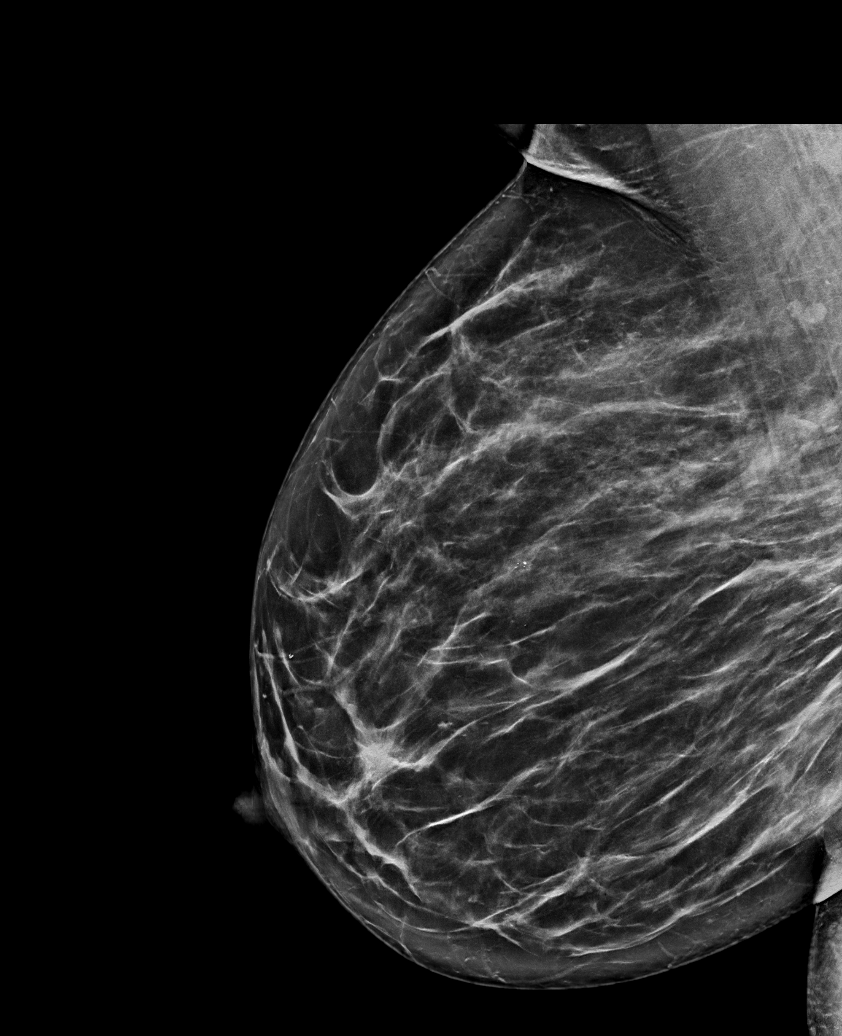

[R CC synth-2D]
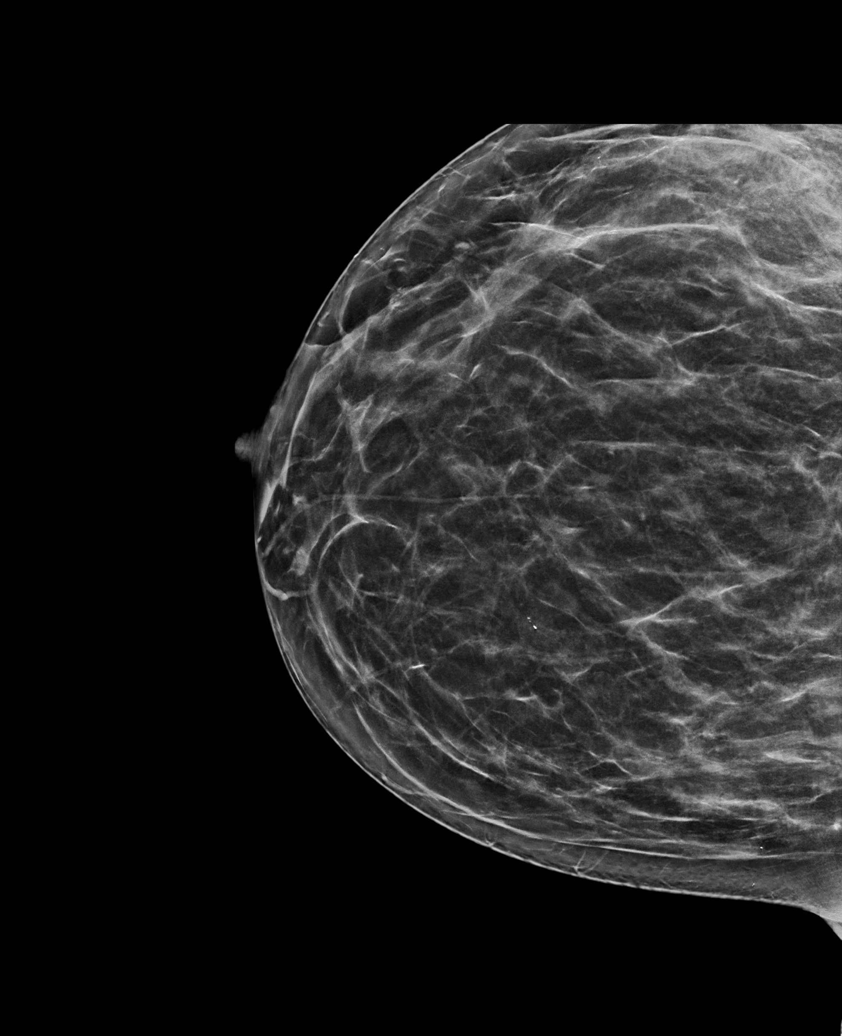

[L MLO synth-2D]
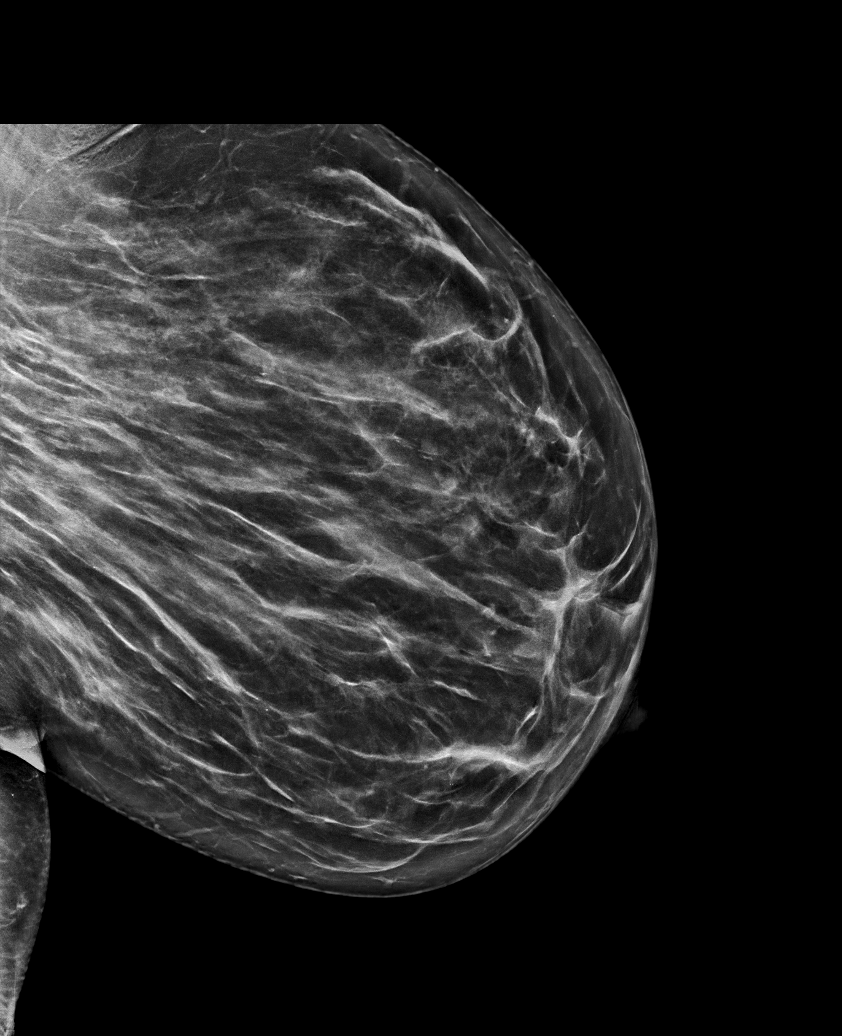

[L CC synth-2D]
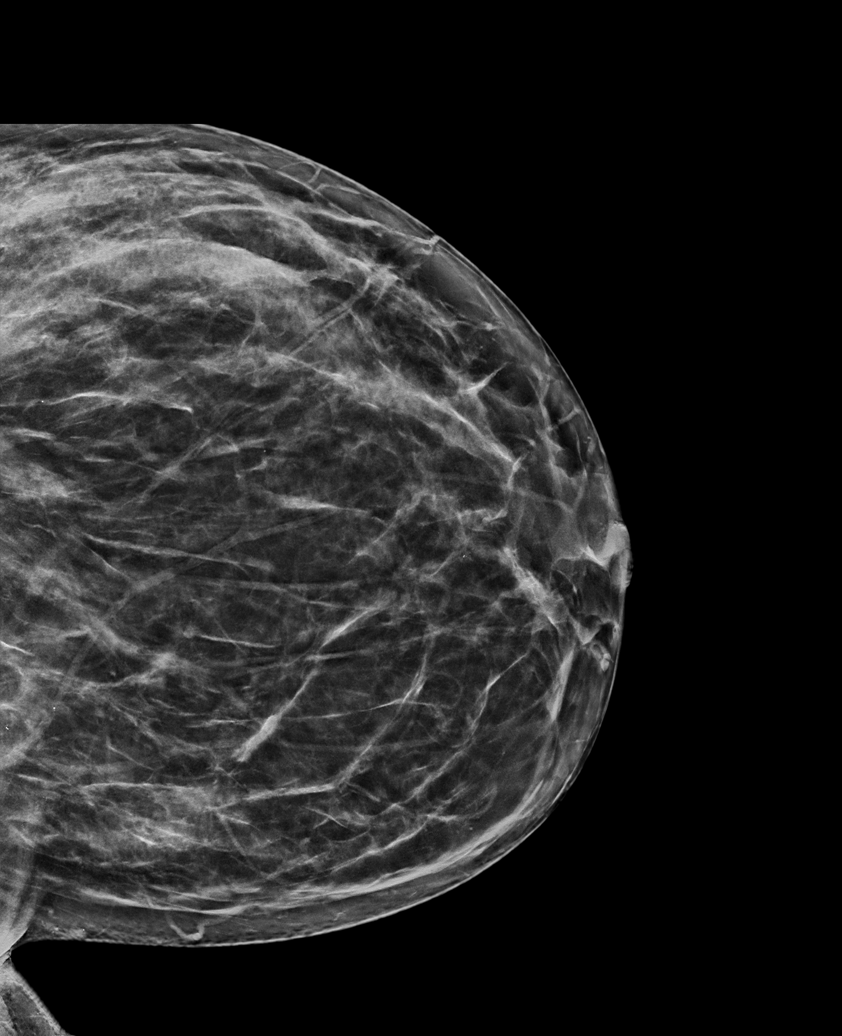

[R TAN tomo · tomo slice 40/79.0]
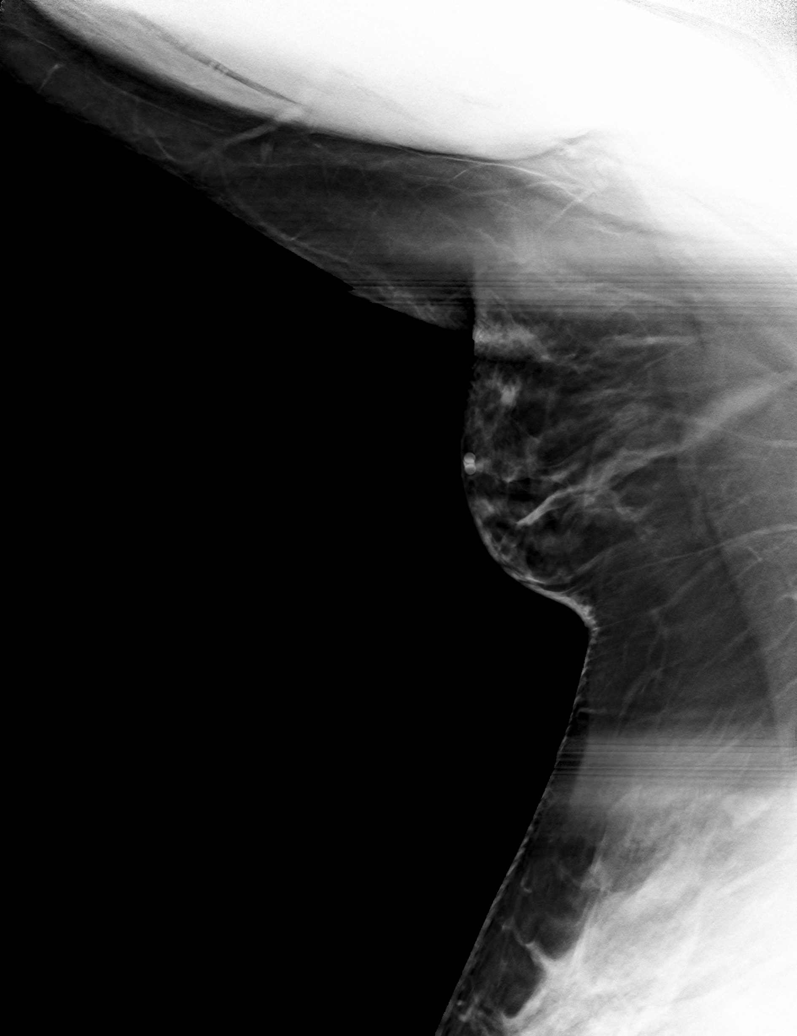

[6 of 30 positions shown; findings below may reference images not displayed]

ACR Breast Density Category c: The breast tissue is heterogeneously
dense, which may obscure small masses.
FINDINGS: No concerning masses, calcifications or distortion identified within
either breast. Underlying the palpable marker within the right
axilla is accessory fibroglandular tissue.

On physical exam, there is a small mass palpated within the right
axilla.

Targeted ultrasound is performed, showing a 6 x 5 x 4 mm hypoechoic
mass just deep to the skin within the right axilla at the site of
palpable concern.
IMPRESSION: Palpable mass right axilla likely represents a sebaceous cyst.

RECOMMENDATION:
Warm compresses applied to the area. Recommend follow-up right
axillary ultrasound in 3 months.

I have discussed the findings and recommendations with the patient.
If applicable, a reminder letter will be sent to the patient
regarding the next appointment.

BI-RADS CATEGORY  3: Probably benign.

## 2022-06-23 IMAGING — US US AXILLARY RIGHT
1 series · 4 of 4 positions shown · non-contrast
Comparison: Prior exams.

CLINICAL DATA: Short-term follow-up for a likely benign right
axillary mass.

EXAM:
ULTRASOUND OF THE RIGHT AXILLA

[Series 1: us axillary right · 0.05mm/px · 4 of 4 slices shown]
[im 1/4]
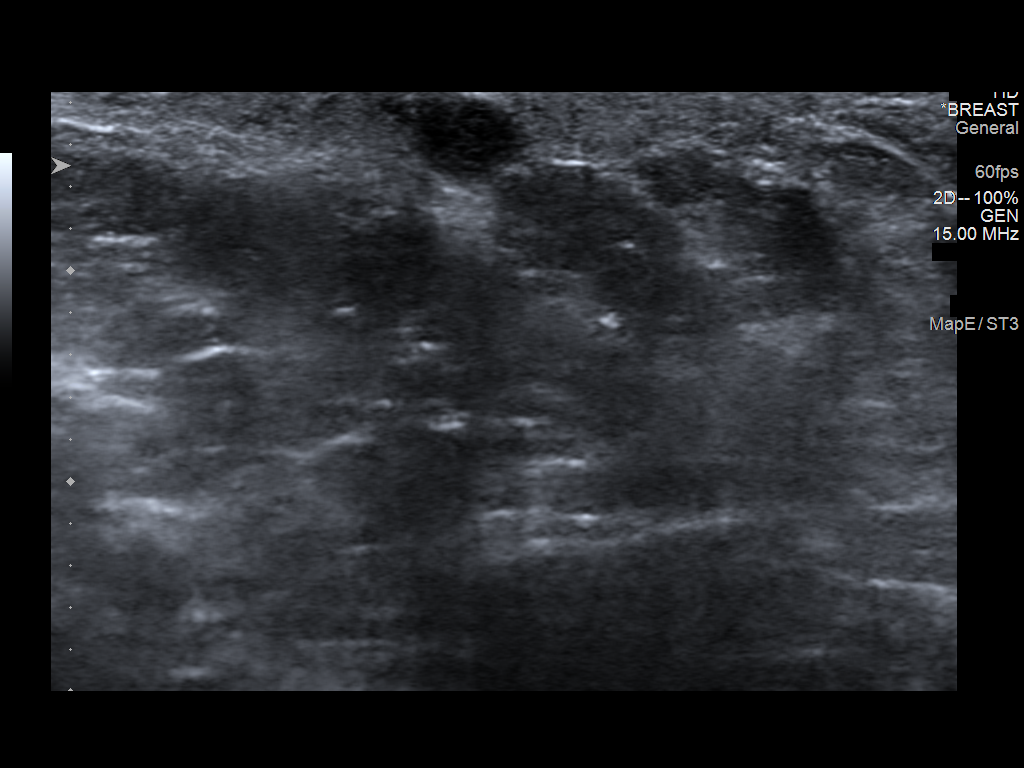
[im 2/4]
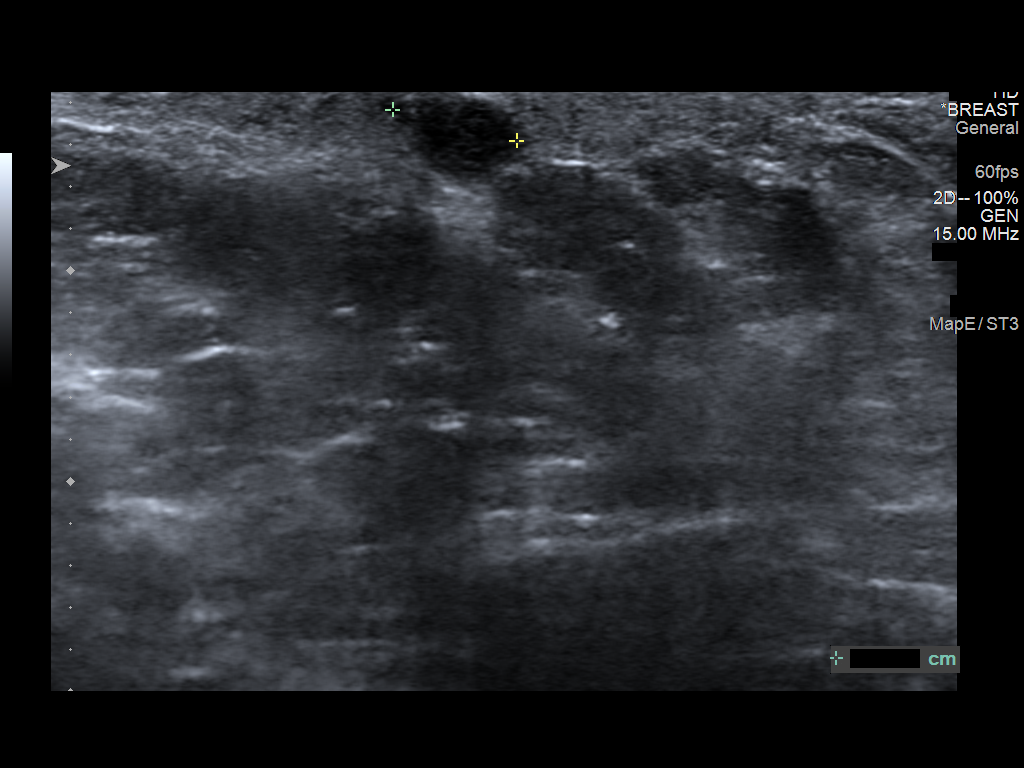
[im 3/4]
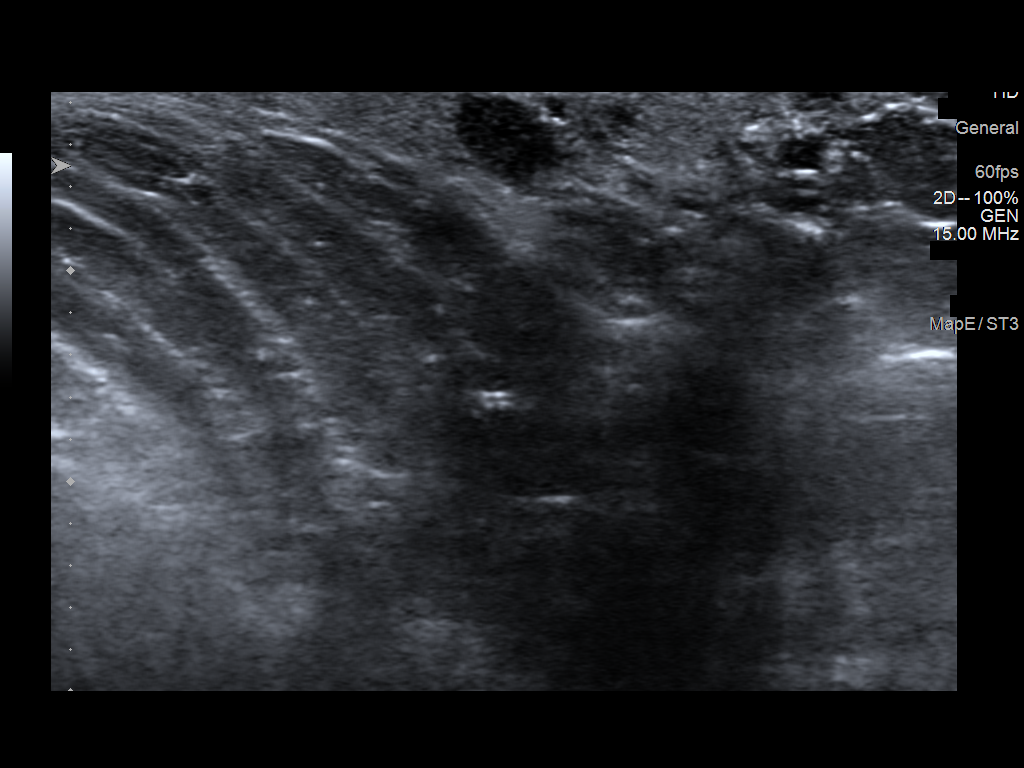
[im 4/4]
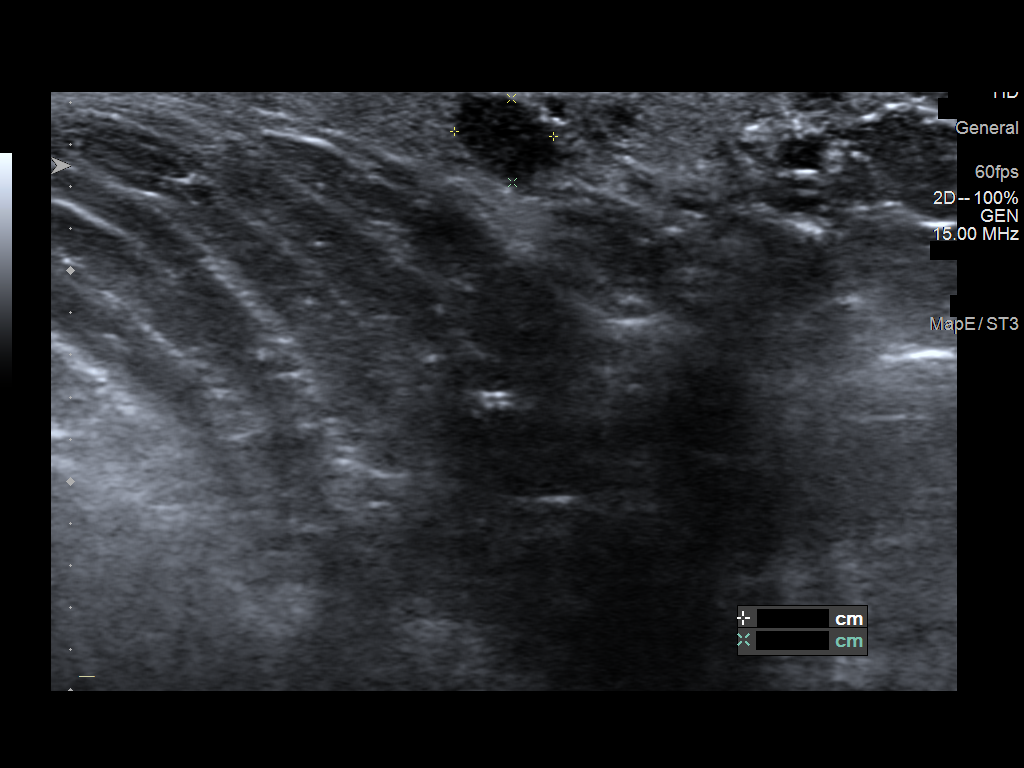

[4 of 4 positions shown; findings below may reference images not displayed]

FINDINGS: On physical exam,a tiny superficial palpable lump is again
identified in the right axilla.

Ultrasound is performed, showing a superficial hypoechoic oval mass
measuring 6 x 4 x 5 mm, previously 6 x 4 x 5 mm.
IMPRESSION: The right axillary mass is sonographically stable.

RECOMMENDATION:
Six-month follow-up right axillary ultrasound.

I have discussed the findings and recommendations with the patient.
If applicable, a reminder letter will be sent to the patient
regarding the next appointment.

BI-RADS CATEGORY  3: Probably benign.

## 2022-11-17 ENCOUNTER — Other Ambulatory Visit: Payer: Self-pay

## 2022-11-17 MED ORDER — INFLUENZA VIRUS VACC SPLIT PF (FLUZONE) 0.5 ML IM SUSY
0.5000 mL | PREFILLED_SYRINGE | Freq: Once | INTRAMUSCULAR | 0 refills | Status: AC
  Filled 2022-11-17: qty 0.5, 1d supply, fill #0

## 2022-12-08 ENCOUNTER — Other Ambulatory Visit: Payer: Self-pay

## 2023-03-09 DIAGNOSIS — Z13220 Encounter for screening for lipoid disorders: Secondary | ICD-10-CM | POA: Diagnosis not present

## 2023-03-09 DIAGNOSIS — Z0000 Encounter for general adult medical examination without abnormal findings: Secondary | ICD-10-CM | POA: Diagnosis not present

## 2023-03-09 DIAGNOSIS — R5383 Other fatigue: Secondary | ICD-10-CM | POA: Diagnosis not present

## 2023-03-09 DIAGNOSIS — E559 Vitamin D deficiency, unspecified: Secondary | ICD-10-CM | POA: Diagnosis not present

## 2023-05-11 ENCOUNTER — Other Ambulatory Visit (HOSPITAL_BASED_OUTPATIENT_CLINIC_OR_DEPARTMENT_OTHER): Payer: Self-pay

## 2023-06-04 ENCOUNTER — Other Ambulatory Visit: Payer: Self-pay

## 2023-11-17 ENCOUNTER — Other Ambulatory Visit: Payer: Self-pay

## 2023-11-17 MED ORDER — FLUZONE 0.5 ML IM SUSY
0.5000 mL | PREFILLED_SYRINGE | Freq: Once | INTRAMUSCULAR | 0 refills | Status: AC
  Filled 2023-11-17: qty 0.5, 1d supply, fill #0
